# Patient Record
Sex: Female | Born: 1955 | Race: Black or African American | Hispanic: No | Marital: Married | Smoking: Never smoker
Health system: Southern US, Community
[De-identification: ages and names within clinical notes are randomized; demographics above are authoritative.]

## PROBLEM LIST (undated history)

## (undated) DIAGNOSIS — I10 Essential (primary) hypertension: Secondary | ICD-10-CM

## (undated) DIAGNOSIS — M1710 Unilateral primary osteoarthritis, unspecified knee: Secondary | ICD-10-CM

## (undated) DIAGNOSIS — F419 Anxiety disorder, unspecified: Secondary | ICD-10-CM

## (undated) DIAGNOSIS — R51 Headache: Secondary | ICD-10-CM

## (undated) DIAGNOSIS — R519 Headache, unspecified: Secondary | ICD-10-CM

## (undated) HISTORY — DX: Unilateral primary osteoarthritis, unspecified knee: M17.10

## (undated) HISTORY — DX: Anxiety disorder, unspecified: F41.9

## (undated) HISTORY — PX: TUBAL LIGATION: SHX77

---

## 2000-04-04 ENCOUNTER — Emergency Department (HOSPITAL_COMMUNITY): Admission: EM | Admit: 2000-04-04 | Discharge: 2000-04-05 | Payer: Self-pay | Admitting: Emergency Medicine

## 2004-09-01 ENCOUNTER — Encounter: Admission: RE | Admit: 2004-09-01 | Discharge: 2004-09-01 | Payer: Self-pay | Admitting: Family Medicine

## 2006-02-14 ENCOUNTER — Emergency Department (HOSPITAL_COMMUNITY): Admission: EM | Admit: 2006-02-14 | Discharge: 2006-02-14 | Payer: Self-pay | Admitting: Emergency Medicine

## 2008-08-05 ENCOUNTER — Ambulatory Visit: Payer: Self-pay | Admitting: Family Medicine

## 2010-05-10 ENCOUNTER — Ambulatory Visit: Payer: Self-pay | Attending: Occupational Medicine | Admitting: Occupational Therapy

## 2010-05-17 ENCOUNTER — Encounter: Payer: Self-pay | Admitting: Occupational Therapy

## 2010-06-16 ENCOUNTER — Encounter: Payer: Self-pay | Admitting: Occupational Therapy

## 2011-06-27 ENCOUNTER — Emergency Department (INDEPENDENT_AMBULATORY_CARE_PROVIDER_SITE_OTHER)
Admission: EM | Admit: 2011-06-27 | Discharge: 2011-06-27 | Disposition: A | Discharge status: Home / Self Care | Payer: Self-pay | Source: Home / Self Care | Attending: Emergency Medicine | Admitting: Emergency Medicine

## 2011-06-27 ENCOUNTER — Encounter (HOSPITAL_COMMUNITY): Payer: Self-pay

## 2011-06-27 DIAGNOSIS — IMO0002 Reserved for concepts with insufficient information to code with codable children: Secondary | ICD-10-CM

## 2011-06-27 DIAGNOSIS — M1710 Unilateral primary osteoarthritis, unspecified knee: Secondary | ICD-10-CM

## 2011-06-27 DIAGNOSIS — M179 Osteoarthritis of knee, unspecified: Secondary | ICD-10-CM

## 2011-06-27 HISTORY — DX: Essential (primary) hypertension: I10

## 2011-06-27 MED ORDER — MELOXICAM 15 MG PO TABS: 15.0000 mg | ORAL_TABLET | Freq: Every day | ORAL | Status: DC

## 2011-06-27 NOTE — ED Notes (Signed)
C/o lt knee soreness.  States sore when she walks- has been bothered with this intermittently since Feb.  Denies swelling today, but states it swells at times.  Denies injury.

## 2011-06-27 NOTE — Discharge Instructions (Signed)
Take the medication as written. Take 1 gram of tylenol with the motrin up to 4 times a day as needed for pain. This with the meloxicam is an effective combination for pain. Do not exceed 4 grams of tylenol a day from all sources. Return if you get worse, have a  fever >100.4, or for any concerns.   Go to www.goodrx.com to look up your medications. This will give you a list of where you can find your prescriptions at the most affordable prices.

## 2011-06-27 NOTE — ED Provider Notes (Signed)
History     CSN: [id number]  Arrival date & time 06/27/11  1019   First MD Initiated Contact with Patient 06/27/11 1112      Chief Complaint  Patient presents with  . Knee Pain    (Consider location/radiation/quality/duration/timing/severity/associated sxs/prior treatment) HPI Comments:  Patient reports intermittent left knee pain, swelling at the past several months. States her knee tends to swell after running, or or doing a lot of walking. States she uses anti-inflammatories, rest, ice with improvement in swelling. No nausea, vomiting, redness, fevers. No recent or remote history of injury to the knee. No history of gout or diabetes. Patient is here today because she needs a work note that states that she is able to return to work.  ROS as noted in HPI. All other ROS negative.   Patient is a 56 y.o. female presenting with knee pain. The history is provided by the patient.  Knee Pain This is a chronic problem. The current episode started more than 1 week ago. The problem occurs rarely. The problem has been resolved. The symptoms are aggravated by walking. The symptoms are relieved by ice, NSAIDs and rest. Treatments tried: Ice, NSAIDs, rest. The treatment provided moderate relief.    Past Medical History  Diagnosis Date  . Hypertension     Past Surgical History  Procedure Date  . Tubal ligation     History reviewed. No pertinent family history.  History  Substance Use Topics  . Smoking status: Never Smoker   . Smokeless tobacco: Not on file  . Alcohol Use: No    OB History    Grav Para Term Preterm Abortions TAB SAB Ect Mult Living                  Review of Systems  Allergies  Review of patient's allergies indicates no known allergies.  Home Medications   Current Outpatient Rx  Name Route Sig Dispense Refill  . CLONIDINE HCL 0.1 MG PO TABS Oral Take 0.1 mg by mouth at bedtime.    Marland Kitchen LISINOPRIL-HYDROCHLOROTHIAZIDE 20-25 MG PO TABS Oral Take 1 tablet by  mouth daily.    . MELOXICAM 15 MG PO TABS Oral Take 1 tablet (15 mg total) by mouth daily. 14 tablet 0    BP 146/89  Pulse 85  Temp(Src) 98.3 F (36.8 C) (Oral)  Resp 16  SpO2 98%  Physical Exam  Nursing note and vitals reviewed. Constitutional: She is oriented to person, place, and time. She appears well-developed and well-nourished. No distress.  HENT:  Head: Normocephalic and atraumatic.  Eyes: Conjunctivae and EOM are normal.  Neck: Normal range of motion.  Cardiovascular: Normal rate.   Pulmonary/Chest: Effort normal.  Abdominal: She exhibits no distension.  Musculoskeletal: Normal range of motion.       Left knee: She exhibits normal range of motion, no swelling, no effusion, no ecchymosis, no deformity, no erythema, normal alignment, no LCL laxity, normal patellar mobility, normal meniscus and no MCL laxity. no tenderness found.       Mild crepitus on flexion-extension.  Neurological: She is alert and oriented to person, place, and time.  Skin: Skin is warm and dry.  Psychiatric: She has a normal mood and affect. Her behavior is normal. Judgment and thought content normal.    ED Course  Procedures (including critical care time)  Labs Reviewed - No data to display No results found.   1. Osteoarthritis, knee      MDM  Patient's currently without complaints.  Suspect mild arthritis flares causing her swelling. No effusion today. No history of trauma. Deferring imaging. Patient's primary concern today is getting a work note stating that she can return to work. Will sent home with knee sleeve, meloxicam, instructions for rest, ice, elevation when it flares and work note clearing her for return to work.Doreatha Lew follow [facility] if needed.  Luiz Blare, MD 06/27/11 1159

## 2011-09-26 ENCOUNTER — Encounter (HOSPITAL_COMMUNITY): Payer: Self-pay

## 2011-09-26 ENCOUNTER — Emergency Department (HOSPITAL_COMMUNITY): Payer: Self-pay

## 2011-09-26 ENCOUNTER — Observation Stay (HOSPITAL_COMMUNITY)
Admission: EM | Admit: 2011-09-26 | Discharge: 2011-09-27 | Disposition: A | Discharge status: Home / Self Care | Payer: Self-pay | Attending: Emergency Medicine | Admitting: Emergency Medicine

## 2011-09-26 DIAGNOSIS — R079 Chest pain, unspecified: Principal | ICD-10-CM | POA: Insufficient documentation

## 2011-09-26 DIAGNOSIS — R110 Nausea: Secondary | ICD-10-CM | POA: Insufficient documentation

## 2011-09-26 DIAGNOSIS — I10 Essential (primary) hypertension: Secondary | ICD-10-CM | POA: Insufficient documentation

## 2011-09-26 DIAGNOSIS — R61 Generalized hyperhidrosis: Secondary | ICD-10-CM | POA: Insufficient documentation

## 2011-09-26 LAB — CBC
HCT: 36.7 % (ref 36.0–46.0)
Hemoglobin: 12 g/dL (ref 12.0–15.0)
MCH: 31.5 pg (ref 26.0–34.0)
MCHC: 32.7 g/dL (ref 30.0–36.0)
MCV: 96.3 fL (ref 78.0–100.0)
Platelets: 214 10*3/uL (ref 150–400)
RBC: 3.81 MIL/uL — ABNORMAL LOW (ref 3.87–5.11)
RDW: 14.8 % (ref 11.5–15.5)
WBC: 6.7 10*3/uL (ref 4.0–10.5)

## 2011-09-26 LAB — POCT I-STAT, CHEM 8
BUN: 14 mg/dL (ref 6–23)
Calcium, Ion: 1.27 mmol/L — ABNORMAL HIGH (ref 1.12–1.23)
Chloride: 102 mEq/L (ref 96–112)
Creatinine, Ser: 1 mg/dL (ref 0.50–1.10)
Glucose, Bld: 115 mg/dL — ABNORMAL HIGH (ref 70–99)
HCT: 39 % (ref 36.0–46.0)
Hemoglobin: 13.3 g/dL (ref 12.0–15.0)
Potassium: 3.5 mEq/L (ref 3.5–5.1)
Sodium: 140 mEq/L (ref 135–145)
TCO2: 28 mmol/L (ref 0–100)

## 2011-09-26 LAB — POCT I-STAT TROPONIN I
Troponin i, poc: 0 ng/mL (ref 0.00–0.08)
Troponin i, poc: 0 ng/mL (ref 0.00–0.08)
Troponin i, poc: 0 ng/mL (ref 0.00–0.08)

## 2011-09-26 LAB — BASIC METABOLIC PANEL
BUN: 15 mg/dL (ref 6–23)
CO2: 29 mEq/L (ref 19–32)
Calcium: 10 mg/dL (ref 8.4–10.5)
Chloride: 101 mEq/L (ref 96–112)
Creatinine, Ser: 0.8 mg/dL (ref 0.50–1.10)
GFR calc Af Amer: >90 mL/min (ref >90)
GFR calc non Af Amer: 81 mL/min — ABNORMAL LOW (ref >90)
Glucose, Bld: 115 mg/dL — ABNORMAL HIGH (ref 70–99)
Potassium: 3.5 mEq/L (ref 3.5–5.1)
Sodium: 139 mEq/L (ref 135–145)

## 2011-09-26 MED ORDER — SODIUM CHLORIDE 0.9 % IV SOLN: 20.0000 mL | INTRAVENOUS | Status: DC

## 2011-09-26 MED ORDER — MORPHINE SULFATE 4 MG/ML IJ SOLN
4.0000 mg | Freq: Once | INTRAMUSCULAR | Status: AC
  Administered 2011-09-26: 4 mg via INTRAVENOUS
  Filled 2011-09-26: qty 1

## 2011-09-26 MED ORDER — METOPROLOL TARTRATE 25 MG PO TABS
100.0000 mg | ORAL_TABLET | Freq: Once | ORAL | Status: AC
  Administered 2011-09-26: 100 mg via ORAL
  Filled 2011-09-26: qty 4

## 2011-09-26 MED ORDER — ASPIRIN 81 MG PO CHEW
324.0000 mg | CHEWABLE_TABLET | Freq: Once | ORAL | Status: AC
  Administered 2011-09-26: 324 mg via ORAL
  Filled 2011-09-26: qty 4

## 2011-09-26 MED ORDER — NITROGLYCERIN 0.4 MG SL SUBL
0.4000 mg | SUBLINGUAL_TABLET | SUBLINGUAL | Status: AC | PRN
  Administered 2011-09-26 (×3): 0.4 mg via SUBLINGUAL

## 2011-09-26 NOTE — ED Provider Notes (Signed)
The patient has been pain free in CDU. Given metoprolol challenge to insure adequate affect on heart rate with good response. Anticipate CTA in a.m. to r/o CAD.  Rodena Medin, PA-C 09/26/11 1950

## 2011-09-26 NOTE — ED Notes (Signed)
Told patient we needed to weigh when she got up to the bathroom

## 2011-09-26 NOTE — ED Notes (Signed)
Pt report nagging chest pain that began yesterday around 10am. Today worsened while out running errands. Pt reports increased chest heaviness and nausea with radiation to left arm. Currently rating pain 6/10.

## 2011-09-26 NOTE — ED Notes (Signed)
PT GIVEN METOPROLOL TRIAL TO SEE IF SHE CAN LOWER HER HEART RATE FOR CT IN THE MORNING. PT AGREEABLE TO TRIAL OF MEDICATION. SINUS RHYTHM ON MONITOR. MEAL ORDERED.

## 2011-09-26 NOTE — ED Provider Notes (Signed)
History     CSN: [id number]  Arrival date & time 09/26/11  1208   First MD Initiated Contact with Patient 09/26/11 1230      Chief Complaint  Patient presents with  . Chest Pain    (Consider location/radiation/quality/duration/timing/severity/associated sxs/prior treatment) HPI  complains of anterior chest pain radiating to left arm onset yesterday morning discomfort is constant however waxes and wanes. She is unable to tell me whether discomfort is exertional. Admits to mild nausea yesterday and breaking out into cold sweat yesterday. No shortness of breath no other associated symptoms presently discomfort is mild. No treatment prior to coming here. Pain is nonpleuritic feels like a heaviness at present Past Medical History  Diagnosis Date  . Hypertension     Past Surgical History  Procedure Date  . Tubal ligation     History reviewed. No pertinent family history.  History  Substance Use Topics  . Smoking status: Never Smoker   . Smokeless tobacco: Not on file  . Alcohol Use: No    OB History    Grav Para Term Preterm Abortions TAB SAB Ect Mult Living                  Review of Systems  Constitutional: Negative.   HENT: Negative.   Cardiovascular: Positive for chest pain.  Gastrointestinal: Positive for nausea.  Musculoskeletal: Negative.   Skin: Negative.   Neurological: Negative.   Hematological: Negative.   Psychiatric/Behavioral: Negative.     Allergies  Review of patient's allergies indicates no known allergies.  Home Medications   Current Outpatient Rx  Name Route Sig Dispense Refill  . CLONIDINE HCL 0.1 MG PO TABS Oral Take 0.1 mg by mouth at bedtime.    Marland Kitchen LISINOPRIL-HYDROCHLOROTHIAZIDE 20-25 MG PO TABS Oral Take 1 tablet by mouth daily.    . MELOXICAM 15 MG PO TABS Oral Take 1 tablet (15 mg total) by mouth daily. 14 tablet 0    BP 180/85  Pulse 96  Temp 98.4 F (36.9 C) (Oral)  Resp 16  Ht 5\' 3"  (1.6 m)  Wt 155 lb (70.308 kg)  BMI 27.46  kg/m2  SpO2 97%  Physical Exam  Nursing note and vitals reviewed. Constitutional: She appears well-developed and well-nourished.  HENT:  Head: Normocephalic and atraumatic.  Eyes: Conjunctivae are normal. Pupils are equal, round, and reactive to light.  Neck: Neck supple. No tracheal deviation present. No thyromegaly present.  Cardiovascular: Normal rate and regular rhythm.   No murmur heard. Pulmonary/Chest: Effort normal and breath sounds normal.  Abdominal: Soft. Bowel sounds are normal. She exhibits no distension. There is no tenderness.  Musculoskeletal: Normal range of motion. She exhibits no edema and no tenderness.  Neurological: She is alert. Coordination normal.  Skin: Skin is warm and dry. No rash noted.  Psychiatric: She has a normal mood and affect.    ED Course  Procedures (including critical care time)  Date: 09/26/2011  Rate: 95  Rhythm: normal sinus rhythm  QRS Axis: normal  Intervals: normal  ST/T Wave abnormalities: normal  Conduction Disutrbances:none  Narrative Interpretation:   Old EKG Reviewed: unchanged No change from 04/04/2000 as interpreted by me Results for orders placed during the hospital encounter of 09/26/11  CBC      Component Value Range   WBC 6.7  4.0 - 10.5 K/uL   RBC 3.81 (*) 3.87 - 5.11 MIL/uL   Hemoglobin 12.0  12.0 - 15.0 g/dL   HCT 40.9  81.1 - 91.4 %  MCV 96.3  78.0 - 100.0 fL   MCH 31.5  26.0 - 34.0 pg   MCHC 32.7  30.0 - 36.0 g/dL   RDW 11.9  14.7 - 82.9 %   Platelets 214  150 - 400 K/uL  BASIC METABOLIC PANEL      Component Value Range   Sodium 139  135 - 145 mEq/L   Potassium 3.5  3.5 - 5.1 mEq/L   Chloride 101  96 - 112 mEq/L   CO2 29  19 - 32 mEq/L   Glucose, Bld 115 (*) 70 - 99 mg/dL   BUN 15  6 - 23 mg/dL   Creatinine, Ser 5.62  0.50 - 1.10 mg/dL   Calcium 13.0  8.4 - 86.5 mg/dL   GFR calc non Af Amer 81 (*) >90 mL/min   GFR calc Af Amer >90  >90 mL/min  POCT I-STAT TROPONIN I      Component Value Range    Troponin i, poc 0.00  0.00 - 0.08 ng/mL   Comment 3           POCT I-STAT, CHEM 8      Component Value Range   Sodium 140  135 - 145 mEq/L   Potassium 3.5  3.5 - 5.1 mEq/L   Chloride 102  96 - 112 mEq/L   BUN 14  6 - 23 mg/dL   Creatinine, Ser 7.84  0.50 - 1.10 mg/dL   Glucose, Bld 696 (*) 70 - 99 mg/dL   Calcium, Ion 2.95 (*) 1.12 - 1.23 mmol/L   TCO2 28  0 - 100 mmol/L   Hemoglobin 13.3  12.0 - 15.0 g/dL   HCT 28.4  13.2 - 44.0 %   Dg Chest Port 1 View  09/26/2011  *RADIOLOGY REPORT*  Clinical Data: Mid chest pain, nausea, diaphoresis  PORTABLE CHEST - 1 VIEW  Comparison: None.  Findings: Normal cardiac silhouette and mediastinal contours.  The lungs appear mildly hyperinflated.  Punctate granuloma overlies the peripheral aspect of the left mid lung.  No focal airspace opacities.  No definite pleural effusion or pneumothorax.  No acute osseous abnormalities.  IMPRESSION: No definite acute cardiopulmonary disease on this AP portable examination. Further evaluation with a PA and lateral chest radiograph may be obtained as clinically indicated.  Original Report Authenticated By: Waynard Reeds, M.D.    Labs Reviewed  CBC  BASIC METABOLIC PANEL   No results found. 3:10 PM patient asymptomatic after treatment with 3 sublingual nitroglycerin, aspirin, and morphine 4 mg IV Chest xrayreviewed by me No diagnosis found.  MDM  Patient felt to be low risk for acute coronary syndrome Plan CDU chest pain protocol        Doug Sou, MD 09/26/11 [phone]

## 2011-09-26 NOTE — ED Notes (Signed)
Pt c/o intermittent (L) side chest pain starting yesterday, radiating down (L) arm today, associated w/nausea. Pt describes her pain as a sharp nagging pain

## 2011-09-26 NOTE — ED Notes (Signed)
Radiology at bedside for portable xray.

## 2011-09-27 ENCOUNTER — Observation Stay (HOSPITAL_COMMUNITY): Payer: Self-pay

## 2011-09-27 MED ORDER — LORAZEPAM 2 MG/ML IJ SOLN
1.0000 mg | Freq: Once | INTRAMUSCULAR | Status: AC
  Administered 2011-09-27: 1 mg via INTRAVENOUS
  Filled 2011-09-27: qty 1

## 2011-09-27 MED ORDER — METOPROLOL TARTRATE 1 MG/ML IV SOLN
10.0000 mg | Freq: Once | INTRAVENOUS | Status: AC
  Administered 2011-09-27: 10 mg via INTRAVENOUS

## 2011-09-27 MED ORDER — METOPROLOL TARTRATE 1 MG/ML IV SOLN
INTRAVENOUS | Status: AC
  Administered 2011-09-27: 10 mg via INTRAVENOUS
  Filled 2011-09-27: qty 10

## 2011-09-27 MED ORDER — NITROGLYCERIN 0.4 MG SL SUBL
0.4000 mg | SUBLINGUAL_TABLET | Freq: Once | SUBLINGUAL | Status: AC
  Administered 2011-09-27: 0.4 mg via SUBLINGUAL

## 2011-09-27 MED ORDER — IOHEXOL 350 MG/ML SOLN
80.0000 mL | Freq: Once | INTRAVENOUS | Status: AC | PRN
  Administered 2011-09-27: 80 mL via INTRAVENOUS

## 2011-09-27 MED ORDER — METOPROLOL TARTRATE 1 MG/ML IV SOLN
INTRAVENOUS | Status: AC
  Filled 2011-09-27: qty 10

## 2011-09-27 MED ORDER — METOPROLOL TARTRATE 1 MG/ML IV SOLN: 5.0000 mg | Freq: Once | INTRAVENOUS | Status: DC

## 2011-09-27 MED ORDER — NITROGLYCERIN 0.4 MG SL SUBL
SUBLINGUAL_TABLET | SUBLINGUAL | Status: AC
  Administered 2011-09-27: 0.4 mg via SUBLINGUAL
  Filled 2011-09-27: qty 25

## 2011-09-27 MED ORDER — ACETAMINOPHEN 325 MG PO TABS
650.0000 mg | ORAL_TABLET | Freq: Once | ORAL | Status: AC
  Administered 2011-09-27: 650 mg via ORAL
  Filled 2011-09-27: qty 2

## 2011-09-27 NOTE — ED Provider Notes (Signed)
Patient reports feeling well this morning without chest pain. She reports a good night sleep. She has no complaints or other symptoms at this time.   Filed Vitals:   09/27/11 0510  BP: 112/70  Pulse: 52  Temp:   Resp: 20   She is well-developed, well-nourished in no acute distress. Heart has regular rate and rhythm. Chest and lungs are clear to auscultation bilaterally. No pitting edema of lower extremities bilaterally.   Patient will be have CTA this morning. Pending those results, she will be discharged with follow up with PCP as needed.   11:33 AM Patient's CTA shows mild CAD. She can be discharged home with follow up with PCP as needed. No need for further work up at this time. Patient should return to the ED if she experiences worsening or concerning symptoms.   Katie Beck, PA-C 09/27/11 1134

## 2011-09-27 NOTE — ED Provider Notes (Signed)
Medical screening examination/treatment/procedure(s) were conducted as a shared visit with non-physician practitioner(s) and myself.  I personally evaluated the patient during the encounter  Doug Sou, MD 09/27/11 [phone]

## 2011-09-27 NOTE — ED Notes (Signed)
Patient transported to CT 

## 2011-09-27 NOTE — ED Notes (Signed)
Pt states she is nervous. Given ativan iv to help her relax and decrease her heart rated for ct

## 2011-10-02 NOTE — ED Provider Notes (Signed)
Medical screening examination/treatment/procedure(s) were conducted as a shared visit with non-physician practitioner(s) and myself.  I personally evaluated the patient during the encounter  Toy Baker, MD 10/02/11 2344

## 2011-11-30 ENCOUNTER — Encounter: Payer: Self-pay | Admitting: Internal Medicine

## 2011-11-30 DIAGNOSIS — M179 Osteoarthritis of knee, unspecified: Secondary | ICD-10-CM | POA: Insufficient documentation

## 2011-11-30 DIAGNOSIS — M1710 Unilateral primary osteoarthritis, unspecified knee: Secondary | ICD-10-CM

## 2011-11-30 DIAGNOSIS — F419 Anxiety disorder, unspecified: Secondary | ICD-10-CM | POA: Insufficient documentation

## 2011-11-30 DIAGNOSIS — I10 Essential (primary) hypertension: Secondary | ICD-10-CM | POA: Insufficient documentation

## 2011-11-30 DIAGNOSIS — Z0000 Encounter for general adult medical examination without abnormal findings: Secondary | ICD-10-CM | POA: Insufficient documentation

## 2011-11-30 HISTORY — DX: Osteoarthritis of knee, unspecified: M17.9

## 2011-11-30 HISTORY — DX: Anxiety disorder, unspecified: F41.9

## 2011-11-30 HISTORY — DX: Unilateral primary osteoarthritis, unspecified knee: M17.10

## 2011-12-10 ENCOUNTER — Ambulatory Visit: Payer: Self-pay | Admitting: Internal Medicine

## 2011-12-10 DIAGNOSIS — Z0289 Encounter for other administrative examinations: Secondary | ICD-10-CM

## 2012-01-16 ENCOUNTER — Ambulatory Visit (INDEPENDENT_AMBULATORY_CARE_PROVIDER_SITE_OTHER): Payer: Self-pay | Admitting: Family Medicine

## 2012-01-16 VITALS — BP 132/78 | HR 61 | Temp 98.2°F | Ht 62.5 in | Wt 154.0 lb

## 2012-01-16 DIAGNOSIS — I10 Essential (primary) hypertension: Secondary | ICD-10-CM

## 2012-01-16 DIAGNOSIS — R2240 Localized swelling, mass and lump, unspecified lower limb: Secondary | ICD-10-CM

## 2012-01-16 DIAGNOSIS — R229 Localized swelling, mass and lump, unspecified: Secondary | ICD-10-CM

## 2012-01-16 MED ORDER — BISOPROLOL FUMARATE 5 MG PO TABS: 5.0000 mg | ORAL_TABLET | Freq: Every day | ORAL | Status: DC

## 2012-01-16 NOTE — Patient Instructions (Addendum)
Thank you for coming in today, it was nice to meet you Your blood pressure looks great today. Continue your current medications other than the bisoprolol-hctz, start bisoprolol only.  Follow up with me once you have the assistance card.

## 2012-01-20 DIAGNOSIS — R2240 Localized swelling, mass and lump, unspecified lower limb: Secondary | ICD-10-CM | POA: Insufficient documentation

## 2012-01-20 NOTE — Assessment & Plan Note (Signed)
Small raised nodular area, very tender.  Likely to be varicosity but could also be something such as glomus tumor given amount of pain associated with it.

## 2012-01-20 NOTE — Assessment & Plan Note (Signed)
Blood pressure well controlled on current medications however she is on two medications that contain HCTZ.  Will discontinue bisoprolol-hctz and change to bisoprolol only.   COntinue lisinopril-hctz and clonidine.  Warned of not discontinuing clonidine abruptly.  She will follow up once she has assistance card and we will check lab work at that time.

## 2012-01-20 NOTE — Progress Notes (Signed)
  Subjective:    Patient ID: Katie Humphrey, female    DOB: 12/11/1956, 56 y.o.   MRN: [id number]  HPI Patient is a new patient here to establish care and discuss  1. HTN:  Has never had PCP, has been receiving medications from a local urgent care.  She is currently using bisoprolol-hctz, lisinopril-hctz, and clonidine.  Her BP has been well controlled on this regiment and she tolerates the medication well.  She does have occasional sharp chest pain but nothing currently.  She denies shortness of breath, headache, palpitations, vision changes.  2. Bump on leg:  Bump on lower r leg.  Has been present for a "couple of years".  Area is quite painful to touch.  She denies any calf swelling or tenderness.  Review of Systems Per HPI    Objective:   Physical Exam  Constitutional: She appears well-nourished. No distress.  Neck: Neck supple.  Cardiovascular: Normal rate, regular rhythm and normal heart sounds.   Pulmonary/Chest: Effort normal and breath sounds normal. No respiratory distress.  Musculoskeletal: She exhibits no edema.       She has a small raised blue-purple colored area on lateral right lower leg.  Very tender to touch. No calf swelling.   Neurological: She is alert.          Assessment & Plan:

## 2012-12-05 ENCOUNTER — Emergency Department (HOSPITAL_COMMUNITY)
Admission: EM | Admit: 2012-12-05 | Discharge: 2012-12-05 | Disposition: A | Discharge status: Home / Self Care | Payer: BC Managed Care – PPO | Source: Home / Self Care | Attending: Family Medicine | Admitting: Family Medicine

## 2012-12-05 ENCOUNTER — Encounter (HOSPITAL_COMMUNITY): Payer: Self-pay | Admitting: Emergency Medicine

## 2012-12-05 DIAGNOSIS — I10 Essential (primary) hypertension: Secondary | ICD-10-CM

## 2012-12-05 LAB — POCT I-STAT, CHEM 8
BUN: 10 mg/dL (ref 6–23)
Calcium, Ion: 1.22 mmol/L (ref 1.12–1.23)
Chloride: 102 mEq/L (ref 96–112)
Creatinine, Ser: 1 mg/dL (ref 0.50–1.10)
Glucose, Bld: 108 mg/dL — ABNORMAL HIGH (ref 70–99)
HCT: 45 % (ref 36.0–46.0)
Hemoglobin: 15.3 g/dL — ABNORMAL HIGH (ref 12.0–15.0)
Potassium: 4.4 mEq/L (ref 3.5–5.1)
Sodium: 140 mEq/L (ref 135–145)
TCO2: 30 mmol/L (ref 0–100)

## 2012-12-05 MED ORDER — CLONIDINE HCL 0.1 MG PO TABS: 0.1000 mg | ORAL_TABLET | Freq: Every day | ORAL | Status: DC

## 2012-12-05 MED ORDER — BISOPROLOL-HYDROCHLOROTHIAZIDE 5-6.25 MG PO TABS: 1.0000 | ORAL_TABLET | Freq: Every day | ORAL | Status: DC

## 2012-12-05 NOTE — ED Provider Notes (Signed)
CSN: [id number]     Arrival date & time 12/05/12  1016 History   None    Chief Complaint  Patient presents with  . Medication Refill    out of BP med x 1 wk.    (Consider location/radiation/quality/duration/timing/severity/associated sxs/prior Treatment) HPI Comments: 57 year old female presents requesting refill of her antihypertensive medications. She ran out one week ago. She's been having a slight headache since that, but this is common with her blood pressure being uncontrolled. She denies nausea, chest pain, lightheadedness, blurry vision.   Past Medical History  Diagnosis Date  . Hypertension   . OA (osteoarthritis) of knee 11/30/2011  . Anxiety 11/30/2011   Past Surgical History  Procedure Laterality Date  . Tubal ligation     History reviewed. No pertinent family history. History  Substance Use Topics  . Smoking status: Never Smoker   . Smokeless tobacco: Never Used  . Alcohol Use: No   OB History   Grav Para Term Preterm Abortions TAB SAB Ect Mult Living                 Review of Systems  Constitutional: Negative for fever and chills.  Eyes: Negative for visual disturbance.  Respiratory: Negative for cough and shortness of breath.   Cardiovascular: Negative for chest pain, palpitations and leg swelling.  Gastrointestinal: Negative for nausea, vomiting and abdominal pain.  Endocrine: Negative for polydipsia and polyuria.  Genitourinary: Negative for dysuria, urgency and frequency.  Musculoskeletal: Negative for arthralgias and myalgias.  Skin: Negative for rash.  Neurological: Positive for headaches. Negative for dizziness, weakness and light-headedness.    Allergies  Review of patient's allergies indicates no known allergies.  Home Medications   Current Outpatient Rx  Name  Route  Sig  Dispense  Refill  . bisoprolol (ZEBETA) 5 MG tablet   Oral   Take 1 tablet (5 mg total) by mouth daily.   30 tablet   6   . bisoprolol-hydrochlorothiazide (ZIAC)  5-6.25 MG per tablet   Oral   Take 1 tablet by mouth daily.   30 tablet   0   . cloNIDine (CATAPRES) 0.1 MG tablet   Oral   Take 1 tablet (0.1 mg total) by mouth at bedtime.   30 tablet   0   . lisinopril-hydrochlorothiazide (PRINZIDE,ZESTORETIC) 20-25 MG per tablet   Oral   Take 1 tablet by mouth daily.          BP 200/120  Pulse 63  Temp(Src) 98.1 F (36.7 C) (Oral)  Resp 12  SpO2 100% Physical Exam  Nursing note and vitals reviewed. Constitutional: She is oriented to person, place, and time. Vital signs are normal. She appears well-developed and well-nourished. No distress.  HENT:  Head: Normocephalic and atraumatic.  Cardiovascular: Normal rate, regular rhythm and normal heart sounds.   Pulmonary/Chest: Effort normal and breath sounds normal. No respiratory distress.  Neurological: She is alert and oriented to person, place, and time. She has normal strength. Coordination normal.  Skin: Skin is warm and dry. No rash noted. She is not diaphoretic.  Psychiatric: She has a normal mood and affect. Judgment normal.    ED Course  Procedures (including critical care time) Labs Review Labs Reviewed  POCT I-STAT, CHEM 8 - Abnormal; Notable for the following:    Glucose, Bld 108 (*)    Hemoglobin 15.3 (*)    All other components within normal limits   Imaging Review No results found.    MDM   1.  Hypertension    Exam is normal. I-STAT is normal. Refilling medications. Followup with primary care.   Meds ordered this encounter  Medications  . cloNIDine (CATAPRES) 0.1 MG tablet    Sig: Take 1 tablet (0.1 mg total) by mouth at bedtime.    Dispense:  30 tablet    Refill:  0    Order Specific Question:  Supervising Provider    Answer:  Clementeen Graham, S [id number]  . bisoprolol-hydrochlorothiazide (ZIAC) 5-6.25 MG per tablet    Sig: Take 1 tablet by mouth daily.    Dispense:  30 tablet    Refill:  0    Order Specific Question:  Supervising Provider    Answer:   Clementeen Graham, Kathie Rhodes [[id number]]       Graylon Good, PA-C 12/05/12 [phone]

## 2012-12-05 NOTE — ED Notes (Signed)
Reports out of BP med Clonidine 1 mg x 1 wk. Having headaches.  Pt does not have primary doctor but does have health insurance.

## 2012-12-06 NOTE — ED Provider Notes (Signed)
Medical screening examination/treatment/procedure(s) were performed by a resident physician or non-physician practitioner and as the supervising physician I was immediately available for consultation/collaboration.  Clementeen Graham, MD    Rodolph Bong, MD 12/06/12 [phone]

## 2012-12-22 ENCOUNTER — Encounter: Payer: Self-pay | Admitting: Family Medicine

## 2012-12-22 ENCOUNTER — Ambulatory Visit (INDEPENDENT_AMBULATORY_CARE_PROVIDER_SITE_OTHER): Payer: BC Managed Care – PPO | Admitting: Family Medicine

## 2012-12-22 VITALS — BP 129/81 | HR 63 | Ht 62.5 in | Wt 160.6 lb

## 2012-12-22 DIAGNOSIS — M1710 Unilateral primary osteoarthritis, unspecified knee: Secondary | ICD-10-CM

## 2012-12-22 DIAGNOSIS — IMO0002 Reserved for concepts with insufficient information to code with codable children: Secondary | ICD-10-CM

## 2012-12-22 DIAGNOSIS — F411 Generalized anxiety disorder: Secondary | ICD-10-CM

## 2012-12-22 DIAGNOSIS — Z0000 Encounter for general adult medical examination without abnormal findings: Secondary | ICD-10-CM

## 2012-12-22 DIAGNOSIS — F419 Anxiety disorder, unspecified: Secondary | ICD-10-CM

## 2012-12-22 DIAGNOSIS — M179 Osteoarthritis of knee, unspecified: Secondary | ICD-10-CM

## 2012-12-22 DIAGNOSIS — I10 Essential (primary) hypertension: Secondary | ICD-10-CM

## 2012-12-22 MED ORDER — BISOPROLOL-HYDROCHLOROTHIAZIDE 5-6.25 MG PO TABS: 1.0000 | ORAL_TABLET | Freq: Every day | ORAL | Status: DC

## 2012-12-22 MED ORDER — CLONIDINE HCL 0.1 MG PO TABS: 0.1000 mg | ORAL_TABLET | Freq: Every day | ORAL | Status: DC

## 2012-12-22 NOTE — Assessment & Plan Note (Signed)
Information given for scheduling outpt mammogram. Colonoscopy normal per pt report within the last 10 years. Pt to f/u for appointment specifically for Pap. Declines Tdap today. 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

## 2012-12-22 NOTE — Assessment & Plan Note (Signed)
Well-controlled on bisoprolol-HCTZ and clonidine; recently ran out for 1 week and had UC visit with pressure 200/100. Refilled both today. Need to consider checking basic labs at f/u.

## 2012-12-22 NOTE — Patient Instructions (Signed)
Thank you for coming in, today!  I refilled your blood pressure medicine, today. Make sure you get a mammogram. We do recommend that you get a tetanus booster and a pap smear You won't be due for a colonoscopy for another 3 or 4 years.  Make an appointment to come back to have a pap smear. Other than that, you can come back in about 6 months, or sooner if you need. Please feel free to call with any questions or concerns at any time, at [phone]. --Dr. Casper Harrison

## 2012-12-22 NOTE — Progress Notes (Signed)
  Subjective:    Patient ID: Katie Humphrey, female    DOB: 12/11/1956, 57 y.o.   MRN: [id number]  HPI: Pt presents to clinic for general follow-up. Specifically requests refill on BP medications.  BP - has been on clonidine and bisoprolol-HCTZ ; recently ran out for about 1 week, went to UC with BP 200/100 -had BP meds refilled, has been better (no headaches, chest pain, LE edema) -currently has no symptoms  Chronic arthritis of knees - baseline, symptoms worse with changes in weather, cold weather - left typically worse than right, no current complaint - does not take any medications currently  Hx of anxiety - in remote past, pt has been prescribed ?Prozac but did not take for more than a few doses (caused hallucinations) - denies current active symptoms; reports being "nervous" at times, but states "it passes" and "I feel pretty good" - denies SI/HI, now or in the past  Health maintenance - uncertain of last pap ("several years"), few years since last mammogram, uncertain last tetanus booster - states she had a colonoscopy in ?2007 (?around age 41), which was normal  Pt is a never smoker. Pt is sexually active with one female partner. Pt has not had periods in >15 years. She is s/p tubal ligation. In addition to the above documentation, pt's PMH, surgical history, FH, and SH all reviewed and updated where appropriate in the EMR. I have also reviewed and updated the pt's allergies and current medications as appropriate.  Review of Systems: Otherwise, full 12-system ROS was reviewed and all negative.     Objective:   Physical Exam BP 129/81  Pulse 63  Ht 5' 2.5" (1.588 m)  Wt 160 lb 9.6 oz (72.848 kg)  BMI 28.89 kg/m2 Gen: well-appearing adult female in NAD HEENT: [place]/AT, sclerae/conjunctivae clear, no lid lag, EOMI, PERRLA   MMM, posterior oropharynx clear, no cervical lymphadenopathy  neck supple with full ROM, no masses appreciated; thyroid not enlarged  Cardio: RRR, no  murmur appreciated; distal pulses intact/symmetric Pulm: CTAB, no wheezes, normal WOB  Abd: soft, nondistended, BS+, no HSM Ext: warm/well-perfused, no cyanosis/clubbing/edema MSK: strength 5/5 in all four extremities, no frank joint deformity/effusion  normal ROM to all four extremities with no point muscle/bony tenderness in spine Neuro/Psych: alert/oriented, sensation grossly intact; normal gait/balance  mood euthymic with congruent affect     Assessment & Plan:

## 2012-12-22 NOTE — Assessment & Plan Note (Signed)
No current complaints or specific medications. Consider plain xrays in the future if re-develops or acutely worsens; suspect degenerative disease. F/u as needed.

## 2012-12-22 NOTE — Assessment & Plan Note (Signed)
No specific current symptoms and pt reports intolerance to ?Prozac in the past. Denies SI/HI. Plan to f/u as needed.

## 2013-01-06 ENCOUNTER — Telehealth: Payer: Self-pay | Admitting: Family Medicine

## 2013-01-06 NOTE — Telephone Encounter (Signed)
Spoke with patient and she knows that she uses clonidine 0.1 mg for HTN, but that also, in the past, it has helped her sleep.  This last prescriptionvct she got was from  a different tablet.  Pt states she is willing to give it a few more times  Katie Humphrey, Katie Humphrey, CMA

## 2013-01-06 NOTE — Telephone Encounter (Signed)
Patient's Rx for clonidine 0.1 mg qHS is for blood pressure, not specifically for sleep. If she is having trouble sleeping, she can try OTC meds such as Benadryl or Unisom. If she is having more issues than that, or if she tries those and they don't help, or if she wants, she can schedule an appointment to be seen. I don't think I have any clinic openings for a couple of weeks, though. Thanks. --CMS

## 2013-01-06 NOTE — Telephone Encounter (Signed)
Pt says she is not sleeping with clonedine .01.  Her prescription shows clonadone HCL. Please advise

## 2013-01-06 NOTE — Telephone Encounter (Signed)
Will fwd. To Dr.Street for review. Thanks. Lorenda Hatchet, Renato Battles

## 2013-02-02 ENCOUNTER — Encounter: Payer: Self-pay | Admitting: Family Medicine

## 2013-02-18 ENCOUNTER — Encounter: Payer: Self-pay | Admitting: Family Medicine

## 2013-02-18 ENCOUNTER — Ambulatory Visit (INDEPENDENT_AMBULATORY_CARE_PROVIDER_SITE_OTHER): Payer: BC Managed Care – PPO | Admitting: Family Medicine

## 2013-02-18 ENCOUNTER — Telehealth: Payer: Self-pay | Admitting: *Deleted

## 2013-02-18 VITALS — Ht 62.5 in | Wt 160.0 lb

## 2013-02-18 DIAGNOSIS — B372 Candidiasis of skin and nail: Secondary | ICD-10-CM | POA: Insufficient documentation

## 2013-02-18 MED ORDER — CLOTRIMAZOLE 1 % EX CREA: 1.0000 "application " | TOPICAL_CREAM | Freq: Two times a day (BID) | CUTANEOUS | Status: DC

## 2013-02-18 NOTE — Telephone Encounter (Signed)
Patient completed PCP change request form on 12/24/12--wants to change to female PCP.  Changed PCP to Dr. Richarda Blade.  Scheduled appt for 03/20/13 at 4:00 pm to meet new MD/CPE.  Gaylene Brooks, RN

## 2013-02-18 NOTE — Progress Notes (Signed)
   Subjective:    Patient ID: Katie Humphrey, female    DOB: 12/11/1956, 56 y.o.   MRN: [id number]  HPI: Pt presents to clinic with complaint of "yeast infection-like rash" for about 3 weeks. The skin in and around her superior gluteal cleft has been red and very irritated. She has been using Gold Bond powder without any real relief. The area is very painful and dries / cracks but has had no bleeding or drainage. She has no vaginal / vulvar involvement and denies vaginal discharge, itching, or burning. She has no systemic symptoms. She is not diabetic and has not had any recent antibiotic use.  Review of Systems: As above. Denies fever / chills, N/V, change in stool habits.     Objective:   Physical Exam Ht 5' 2.5" (1.588 m)  Wt 160 lb (72.576 kg)  BMI 28.78 kg/m2 Gen: well-appearing adult female in no acute distress Skin: superior portion of gluteal cleft red, shiny, with small circular areas of redness / maceration  Overall appearance consistent with candidal-type skin rash and satellite lesions GU: no external vaginal abnormalities, lesions, or rash HEENT: [place]/AT, sclerae clear Abd: soft, nontender, nondistended Ext: warm, well-perfused, no LE edema     Assessment & Plan:

## 2013-02-18 NOTE — Assessment & Plan Note (Signed)
A: Rash present to superior portion of gluteal cleft, consistent with candidal-type rash +/- component of intertrigo. No systemic symptoms and no vaginal / vulvar complaints.  P: Rx for clotrimazole cream. Discussed appropriate skin care (normal hygiene, careful skin drying, use of medications). Also advised she may continue to use Gold Bond or similar powder between applications of antifungal cream. F/u as needed or in 1 week if worsening / spreading.

## 2013-02-18 NOTE — Patient Instructions (Signed)
Thank you for coming in, today!  You have a yeast infection between your buttocks. You can use a anti-yeast cream that I will prescribe. You can use medicated powder between applications of the cream if you want. You can shower like normal. When you dry off, make sure the skin that has the rash is dried very carefully and try not to leave moisture in folds of skin. You should use the cream twice a day until the rash improves. After that, use it for 1 more week once a day, then you can use it, as needed. If you have worse rash, itching / burning, or if the area starts to be much redder, gets swollen, or starts to drain yellowish or clear fluid, come back to clinic right away (this could be a worse infection). Also make sure to let us know if you have systemic symptoms like fever, chills, nausea, vomiting, belly pain or diarrhea.  You can come back to follow-up as needed. You can talk to our clinic nurse about changing doctors. Please feel free to call with any questions or concerns at any time, at [phone]. --Dr. Casper Harrison

## 2013-03-17 ENCOUNTER — Ambulatory Visit (INDEPENDENT_AMBULATORY_CARE_PROVIDER_SITE_OTHER): Payer: BC Managed Care – PPO | Admitting: Family Medicine

## 2013-03-17 ENCOUNTER — Encounter: Payer: Self-pay | Admitting: Family Medicine

## 2013-03-17 VITALS — BP 150/99 | HR 66 | Temp 98.5°F | Ht 62.5 in | Wt 160.0 lb

## 2013-03-17 DIAGNOSIS — G4700 Insomnia, unspecified: Secondary | ICD-10-CM | POA: Insufficient documentation

## 2013-03-17 DIAGNOSIS — I10 Essential (primary) hypertension: Secondary | ICD-10-CM

## 2013-03-17 MED ORDER — BISOPROLOL-HYDROCHLOROTHIAZIDE 5-6.25 MG PO TABS: 1.0000 | ORAL_TABLET | Freq: Every day | ORAL | Status: DC

## 2013-03-17 MED ORDER — CLONIDINE HCL 0.1 MG PO TABS: 0.1000 mg | ORAL_TABLET | Freq: Every day | ORAL | Status: DC

## 2013-03-17 NOTE — Assessment & Plan Note (Signed)
Elevated BP today, previously well controlled. Suspect elevated BP today related to stress from work, possible white coat HTN, with more normal BPs reported from home. Meds - Bisoprolol-HCTZ 5-6.25mg  daily, Clonidine 0.1mg  bedtime (for insomnia) No complications   Plan:  1. Continue current BP meds - Refilled both Bisoprolol-HCTZ and Clonidine 2. No indication for labs today. Last Cr checked 11/2012, normal. 3. Advised to keep detailed BP journal (3x week, home and drug store) bring in to review in 1 month. 4. RTC 1 month to re-check BP.  Future Considerations: - BP Med changes. If needed, would split combo pill into Bisoprolol 5mg  daily and inc HCTZ to 12.5mg  daily - fasting lipid panel for more accurate calculation for 10 yr ACSVD risk, given cardiac risk factors ( age >43, AA race, HTN)

## 2013-03-17 NOTE — Assessment & Plan Note (Signed)
Chronic h/o insomnia, noted remote h/o treatment for mood disorder? With prozac, but did not tolerate. Suspect insomnia likely secondary Currently on Clonidine 0.1mg  nightly (started in 2009). Note tried OTC Melatonin without success  Plan: 1. Refilled Clonidine 2. Discussed sleep hygiene, overall no major problems identified  Future: - Consider trial of low dose Amitriptyline 10mg  nightly for sleep instead of Clonidine

## 2013-03-17 NOTE — Patient Instructions (Signed)
Dear Katie Humphrey, Thank you for coming in to clinic today. It was good to meet you!  Today we discussed your Blood Pressure. 1. Overall, it looks like your blood pressure is fairly well controlled on your current medicines (Bisoprolol-HCTZ), as the Clonidine is probably not affecting your blood pressure at all, and is only helping you sleep. 2. We will not make any changes to your meds today, I have sent refills for both meds to your pharmacy. 3. We recommend that you continue to check your blood pressure at home and keep a detailed journal of your readings (time of day, activities etc), check your pressure at least 3 x a week for 1 month, and bring that journal in to our office for review. Also, I would recommend checking your pressure at a store like [facility] to get a different reading from a different machine. 4. If your pressures remain controlled < 140 / 90 consistently at home, then you may not need any further changes to your medicines. 5. For your sleep, we will continue the Clonidine at this time, however this is something that can be discussed further with your regular doctor. The most beneficial treatments for sleep / insomnia are usually behavioral changes ("Sleep Hygiene"), otherwise if those changes are unsuccessful, we can consider a different medication in the future.  Some important numbers from today's visit: BP - 150/99, re-checked at 140/98  Please schedule a follow-up appointment with Dr. Sherril Cong (PCP) in 1 month for BP re-check.  If you have any other questions or concerns, please feel free to call the clinic to contact me. You may also schedule an earlier appointment if necessary.  However, if your symptoms get significantly worse, please go to the Emergency Department to seek immediate medical attention.  Nobie Putnam, DO [facility]   Insomnia Insomnia is frequent trouble falling and/or staying asleep. Insomnia can be a long term  problem or a short term problem. Both are common. Insomnia can be a short term problem when the wakefulness is related to a certain stress or worry. Long term insomnia is often related to ongoing stress during waking hours and/or poor sleeping habits. Overtime, sleep deprivation itself can make the problem worse. Every little thing feels more severe because you are overtired and your ability to cope is decreased. CAUSES   Stress, anxiety, and depression.  Poor sleeping habits.  Distractions such as TV in the bedroom.  Naps close to bedtime.  Engaging in emotionally charged conversations before bed.  Technical reading before sleep.  Alcohol and other sedatives. They may make the problem worse. They can hurt normal sleep patterns and normal dream activity.  Stimulants such as caffeine for several hours prior to bedtime.  Pain syndromes and shortness of breath can cause insomnia.  Exercise late at night.  Changing time zones may cause sleeping problems (jet lag). It is sometimes helpful to have someone observe your sleeping patterns. They should look for periods of not breathing during the night (sleep apnea). They should also look to see how long those periods last. If you live alone or observers are uncertain, you can also be observed at a sleep clinic where your sleep patterns will be professionally monitored. Sleep apnea requires a checkup and treatment. Give your caregivers your medical history. Give your caregivers observations your family has made about your sleep.  SYMPTOMS   Not feeling rested in the morning.  Anxiety and restlessness at bedtime.  Difficulty falling and staying  asleep. TREATMENT   Your caregiver may prescribe treatment for an underlying medical disorders. Your caregiver can give advice or help if you are using alcohol or other drugs for self-medication. Treatment of underlying problems will usually eliminate insomnia problems.  Medications can be prescribed  for short time use. They are generally not recommended for lengthy use.  Over-the-counter sleep medicines are not recommended for lengthy use. They can be habit forming.  You can promote easier sleeping by making lifestyle changes such as:  Using relaxation techniques that help with breathing and reduce muscle tension.  Exercising earlier in the day.  Changing your diet and the time of your last meal. No night time snacks.  Establish a regular time to go to bed.  Counseling can help with stressful problems and worry.  Soothing music and white noise may be helpful if there are background noises you cannot remove.  Stop tedious detailed work at least one hour before bedtime. HOME CARE INSTRUCTIONS   Keep a diary. Inform your caregiver about your progress. This includes any medication side effects. See your caregiver regularly. Take note of:  Times when you are asleep.  Times when you are awake during the night.  The quality of your sleep.  How you feel the next day. This information will help your caregiver care for you.  Get out of bed if you are still awake after 15 minutes. Read or do some quiet activity. Keep the lights down. Wait until you feel sleepy and go back to bed.  Keep regular sleeping and waking hours. Avoid naps.  Exercise regularly.  Avoid distractions at bedtime. Distractions include watching television or engaging in any intense or detailed activity like attempting to balance the household checkbook.  Develop a bedtime ritual. Keep a familiar routine of bathing, brushing your teeth, climbing into bed at the same time each night, listening to soothing music. Routines increase the success of falling to sleep faster.  Use relaxation techniques. This can be using breathing and muscle tension release routines. It can also include visualizing peaceful scenes. You can also help control troubling or intruding thoughts by keeping your mind occupied with boring or  repetitive thoughts like the old concept of counting sheep. You can make it more creative like imagining planting one beautiful flower after another in your backyard garden.  During your day, work to eliminate stress. When this is not possible use some of the previous suggestions to help reduce the anxiety that accompanies stressful situations. MAKE SURE YOU:   Understand these instructions.  Will watch your condition.  Will get help right away if you are not doing well or get worse. Document Released: 02/03/2000 Document Revised: 04/30/2011 Document Reviewed: 03/05/2007 [facility] Patient Information 2014 [place].

## 2013-03-17 NOTE — Progress Notes (Signed)
Subjective:     Patient ID: Katie Humphrey, female   DOB: 12/11/1956, 58 y.o.   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 Reports no complaints. Has 1 refill remaining on BP meds, concerned that last time she ran out her BP was significantly elevated. Current Meds - Bisoprolol-HCTZ 5-6.25mg  daily in AM, Clonidine 0.1mg  bedtime (for sleep)   Reports good compliance, took meds today. Tolerating well, w/o complaints. Home Monitoring - Checks BP at home, recalls pressure being about 122/80 last checked. Lifestyle - Walks for exercise Denies CP, dyspnea, claudication, HA, edema, dizziness / lightheadedness  INSOMNIA: Reports chronic h/o insomnia Currently taking Clonidine 0.1mg  once at bedtime for sleep, stated that this was originally prescribed back in 2009 in the ED for BP, however it helps her sleep. Not interested in switching this medicine today, but agreeable to potential alternative in the future. - Admits to good sleep hygiene. No TV in bedroom, does not spend time in bedroom during day, walks for exercise, limited caffeine (1 cup coffee in morning daily).  I have reviewed and updated the following as appropriate: allergies and current medications  Social Hx: Never smoker. Occupation - [employer] bus driver.   Review of Systems  See above HPI, additionally: Admits chronic left knee pain.     Objective:   Physical Exam  BP 150/99  Pulse 66  Temp(Src) 98.5 F (36.9 C) (Oral)  Ht 5' 2.5" (1.588 m)  Wt 160 lb (72.576 kg)  BMI 28.78 kg/m2  Re-checked BP (manual) at 140/98  Gen - pleasant, well-appearing, NAD HEENT - PERRL, EOMI, MMM Neck - supple, non-tender, no carotid bruits Heart - RRR, no murmurs heard Lungs - CTAB, no wheezing, crackles, or rhonchi. Normal work of breathing. Skin - warm, dry, no rashes Neuro - awake, alert,  oriented, grossly non-focal, intact muscle strength 5/5 b/l, intact distal sensation to light touch     Assessment:     See specific A&P problem list for details.      Plan:     See specific A&P problem list for details.

## 2013-03-20 ENCOUNTER — Encounter: Payer: Self-pay | Admitting: Family Medicine

## 2013-05-01 ENCOUNTER — Encounter: Payer: Self-pay | Admitting: Emergency Medicine

## 2013-05-01 ENCOUNTER — Ambulatory Visit (HOSPITAL_COMMUNITY)
Admission: RE | Admit: 2013-05-01 | Discharge: 2013-05-01 | Disposition: A | Discharge status: Home / Self Care | Payer: No Typology Code available for payment source | Source: Ambulatory Visit | Attending: Family Medicine | Admitting: Family Medicine

## 2013-05-01 ENCOUNTER — Ambulatory Visit (INDEPENDENT_AMBULATORY_CARE_PROVIDER_SITE_OTHER): Payer: No Typology Code available for payment source | Admitting: Emergency Medicine

## 2013-05-01 VITALS — BP 126/91 | HR 65 | Temp 98.4°F | Wt 159.0 lb

## 2013-05-01 DIAGNOSIS — R079 Chest pain, unspecified: Secondary | ICD-10-CM | POA: Insufficient documentation

## 2013-05-01 LAB — POCT GLYCOSYLATED HEMOGLOBIN (HGB A1C): Hemoglobin A1C: 5.6

## 2013-05-01 MED ORDER — NITROGLYCERIN 0.4 MG SL SUBL: 0.4000 mg | SUBLINGUAL_TABLET | SUBLINGUAL | Status: DC | PRN

## 2013-05-01 NOTE — Progress Notes (Signed)
   Subjective:    Patient ID: Katie Humphrey, female    DOB: 12/11/1956, 58 y.o.   MRN: [id number]  HPI Katie Humphrey is here for chest pains.  She reports intermittent chest pains for the last 2 weeks.  Occuring 0-2 times per day.  Located centrally, described as a "nagging ache," no radiation.  Associated with a funny feeling in her left arm and feeling warm.  Will sometimes get some mild shortness of breath.  No associated dizziness.  They last for 5-10 minutes and resolve spontaneously.  Mostly seem to occur at rest, no identified triggers.  No active chest pain.  She has hypertension.  No diabetes and is a non smoker.  Family history is positive for heart disease in her mother, sisters and brother.  She does take a baby aspirin a day.  Current Outpatient Prescriptions on File Prior to Visit  Medication Sig Dispense Refill  . bisoprolol-hydrochlorothiazide (ZIAC) 5-6.25 MG per tablet Take 1 tablet by mouth daily.  30 tablet  3  . cloNIDine (CATAPRES) 0.1 MG tablet Take 1 tablet (0.1 mg total) by mouth at bedtime.  30 tablet  3  . clotrimazole (LOTRIMIN) 1 % cream Apply 1 application topically 2 (two) times daily.  30 g  1   No current facility-administered medications on file prior to visit.    I have reviewed and updated the following as appropriate: allergies, current medications, past family history, past medical history and past social history SHx: never smoker   Review of Systems See HPI    Objective:   Physical Exam BP 126/91  Pulse 65  Temp(Src) 98.4 F (36.9 C) (Oral)  Wt 159 lb (72.122 kg) Gen: alert, cooperative, NAD HEENT: AT/, sclera white, MMM Neck: supple, no bruits CV: RRR, no murmurs Pulm: CTAB, no wheezes or rales Ext: no edema, 2+ radial pulses      Assessment & Plan:

## 2013-05-01 NOTE — Assessment & Plan Note (Signed)
Both typical and atypical components. With her strong family history, will work up for angina. Referral to cardiology placed for stress test. Lipids, A1c and TSH today. Continue daily aspirin. Prescription for nitroglycerin sent. Reasons to go the ER reviewed as in AVS. F/u in 1 month.

## 2013-05-01 NOTE — Patient Instructions (Signed)
It was nice to meet you!  I am a little worried that the chest pains you are having are coming from your heart. - keep taking the daily aspirin - I sent in nitroglycerin.  Let 1 pill dissolve under your tongue the next time you have chest pain to see if it helps.  It might give you a headache or make you dizzy. - You should be getting a call in the next week to set up an appointment with a cardiologist.  GO TO THE EMERGENCY ROOM IF... - the chest pain is severe - the pain lasts more than 15 minutes - the pain causes you to feel really short of breath or dizzy  Follow up with your regular doctor in about 1 month.   Angina Pectoris Angina pectoris is extreme discomfort in your chest, neck, or arm. Your doctor may call it just angina. It is caused by a lack of oxygen to your heart wall. It may feel like tightness or heavy pressure. It may feel like a crushing or squeezing pain. Some people say it feels like gas. It may go down your shoulders, back, and arms. Some people have symptoms other than pain. These include:  Tiredness.  Shortness of breath.  Cold sweats.  Feeling sick to your stomach (nausea). There are four types of angina:  Stable angina often lasts the same amount of time each time it happens. Activity, stress, or excitement can bring it on. It often gets better after taking a special medicine called nitroglycerin. This goes under your tongue.  Unstable angina can happen when you are not active or even during sleep. It can suddenly get worse or happen more often. It may not get better after taking the special medicine. It can last up to 30 minutes.  Microvascular angina is more common in women. It may be more severe or last longer than other types.  Prinzmetal angina often happens when you are not active or in the early morning hours. HOME CARE   Only take medicines as told by your doctor.  Stay active or exercise more as told by your doctor.  Limit very hard activity  as told by your doctor.  Limit heavy lifting as told by your doctor.  Keep a healthy weight.  Learn about and eat foods that are healthy for your heart.  Do not smoke. GET HELP RIGHT AWAY IF:   You have chest, neck, deep shoulder, or arm pain or discomfort that lasts more than a few minutes.  You have chest, neck, deep shoulder, or arm pain or discomfort that goes away and comes back over and over again.  You have heavy sweating that seems to happen for no reason.  You have shortness of breath or trouble breathing.  Your angina does not get better after a few minutes of rest.  Your angina does not get better after you take nitroglycerin medicine. These can all be symptoms of a heart attack. Get help right away. Call your local emergency service (911 in [place] Do not  drive yourself to the hospital. Do not  wait to for your symptoms to go away. MAKE SURE YOU:   Understand these instructions.  Will watch your condition.  Will get help right away if you are not doing well or get worse. Document Released: 07/25/2007 Document Revised: 01/23/2012 Document Reviewed: 11/15/2011 [facility] Patient Information 2014 [place], [place].

## 2013-05-02 LAB — LIPID PANEL
Cholesterol: 193 mg/dL (ref 0–200)
HDL: 91 mg/dL (ref >39)
LDL Cholesterol: 84 mg/dL (ref 0–99)
Total CHOL/HDL Ratio: 2.1 Ratio
Triglycerides: 88 mg/dL (ref <150)
VLDL: 18 mg/dL (ref 0–40)

## 2013-05-02 LAB — TSH: TSH: 0.467 u[IU]/mL (ref 0.350–4.500)

## 2013-05-13 ENCOUNTER — Institutional Professional Consult (permissible substitution): Payer: No Typology Code available for payment source | Admitting: Cardiology

## 2013-06-14 ENCOUNTER — Encounter: Payer: Self-pay | Admitting: Cardiology

## 2013-06-15 ENCOUNTER — Ambulatory Visit (INDEPENDENT_AMBULATORY_CARE_PROVIDER_SITE_OTHER): Payer: No Typology Code available for payment source | Admitting: Cardiology

## 2013-06-15 ENCOUNTER — Encounter: Payer: Self-pay | Admitting: Cardiology

## 2013-06-15 VITALS — BP 142/90 | HR 68 | Ht 61.0 in | Wt 156.8 lb

## 2013-06-15 DIAGNOSIS — R079 Chest pain, unspecified: Secondary | ICD-10-CM

## 2013-06-15 DIAGNOSIS — I10 Essential (primary) hypertension: Secondary | ICD-10-CM

## 2013-06-15 NOTE — Assessment & Plan Note (Signed)
The patient does have risk factors for coronary disease. Her current chest pain is not exertional. EKG does not show any marked abnormalities. I have decided to proceed with a standard GXT tomorrow. I will do this with her and then we will decide if any further workup is needed.

## 2013-06-15 NOTE — Progress Notes (Signed)
Patient ID: Katie Humphrey, female   DOB: 12/11/1956, 58 y.o.   MRN: [id number]    HPI  Patient is seen today as a new patient evaluation for chest discomfort. There is no prior documented coronary disease. There is a history of treated hypertension. She does not have diabetes. Her LDL was 84. There is a family history of coronary disease. Her mother had did have an MI at a relatively early age. She has had intermittent slight chest discomfort since early March, 2015. The discomfort comes on in a random fashion. It is not related to exertion. It resolved spontaneously. Sometimes she has tingling in her fingers with it. There is no nausea vomiting or diaphoresis. Her EKG when checked in March showed no significant abnormality.  As part of today's evaluation I have carefully reviewed the information in the electronic medical record. I have reviewed her primary care office note.  No Known Allergies  Current Outpatient Prescriptions  Medication Sig Dispense Refill  . bisoprolol-hydrochlorothiazide (ZIAC) 5-6.25 MG per tablet Take 1 tablet by mouth daily.  30 tablet  3  . cloNIDine (CATAPRES) 0.1 MG tablet Take 1 tablet (0.1 mg total) by mouth at bedtime.  30 tablet  3  . clotrimazole (LOTRIMIN) 1 % cream Apply 1 application topically 2 (two) times daily.  30 g  1  . nitroGLYCERIN (NITROSTAT) 0.4 MG SL tablet Place 1 tablet (0.4 mg total) under the tongue every 5 (five) minutes as needed for chest pain.  50 tablet  3   No current facility-administered medications for this visit.    History   Social History  . Marital Status: Single    Spouse Name: N/A    Number of Children: N/A  . Years of Education: N/A   Occupational History  . Not on file.   Social History Main Topics  . Smoking status: Never Smoker   . Smokeless tobacco: Never Used  . Alcohol Use: No  . Drug Use: No  . Sexual Activity: Not Currently   Other Topics Concern  . Not on file   Social History Narrative  . No  narrative on file    No family history on file.  Past Medical History  Diagnosis Date  . Hypertension   . OA (osteoarthritis) of knee 11/30/2011  . Anxiety 11/30/2011    Past Surgical History  Procedure Laterality Date  . Tubal ligation      Patient Active Problem List   Diagnosis Date Noted  . Chest pain 05/01/2013  . Insomnia 03/17/2013  . Skin yeast infection 02/18/2013  . Anxiety 11/30/2011  . OA (osteoarthritis) of knee 11/30/2011  . Preventative health care 11/30/2011  . Hypertension     ROS   Patient denies fever, chills, headache, sweats, rash, change in vision, change in hearing,  cough, nausea vomiting, urinary symptoms.  PHYSICAL EXAM   Patient is oriented to person time and place. Affect is normal. In general she looks quite stable. Head is atraumatic. Conjunctiva are normal. There is no jugulovenous distention. Lungs are clear. Respiratory effort is nonlabored. Cardiac exam reveals S1 and S2. There are no significant murmurs. The abdomen is soft. There is no peripheral edema. There are no musculoskeletal deformities. There are no skin rashes.  Filed Vitals:   06/15/13 1107  BP: 142/90  Pulse: 68  Height: 5\' 1"  (1.549 m)  Weight: 156 lb 12.8 oz (71.124 kg)   I reviewed her EKG from May 01, 2013. It was normal. I have not repeated  an EKG today as we will obtain 1 with her GXT tomorrow.  ASSESSMENT & PLAN

## 2013-06-15 NOTE — Assessment & Plan Note (Signed)
Her blood pressure is treated. No change in therapy.

## 2013-06-15 NOTE — Patient Instructions (Addendum)
**Note De-Identified Heman Que Obfuscation** Your physician has requested that you have an exercise tolerance test. For further information please visit [email or web address]. Please also follow instruction sheet, as given. Please arrive in the office at 10:45 on 06/16/13  Your physician recommends that you continue on your current medications as directed. Please refer to the Current Medication list given to you today.

## 2013-06-16 ENCOUNTER — Encounter: Payer: Self-pay | Admitting: Cardiology

## 2013-06-16 ENCOUNTER — Ambulatory Visit (INDEPENDENT_AMBULATORY_CARE_PROVIDER_SITE_OTHER): Payer: No Typology Code available for payment source | Admitting: Cardiology

## 2013-06-16 DIAGNOSIS — R079 Chest pain, unspecified: Secondary | ICD-10-CM

## 2013-06-16 NOTE — Progress Notes (Signed)
Exercise Treadmill Test  Pre-Exercise Testing Evaluation Rhythm: normal sinus  Rate: 60   PR:  .18 QRS:  .06  QT:  .42             Test  Exercise Tolerance Test Ordering MD: Dola Argyle, MD  Interpreting MD: Dola Argyle, MD  Unique Test No: 1 Treadmill:  1  Indication for ETT: chest pain - rule out ischemia  Contraindication to ETT: No   Stress Modality: exercise - treadmill  Cardiac Imaging Performed: non   Protocol: standard Bruce - maximal  Max BP:  177/111  Max MPHR (bpm):  163 85% MPR (bpm):  139  MPHR obtained (bpm):  142 % MPHR obtained:  87  Reached 85% MPHR (min:sec):  4:20 Total Exercise Time (min-sec):  6:00  Workload in METS:  7.0 Borg Scale: 15  Reason ETT Terminated:  Total HR reached    ST Segment Analysis At Rest: normal ST segments - no evidence of significant ST depression With Exercise: no evidence of significant ST depression  Other Information Arrhythmia:  No Angina during ETT:  absent (0) Quality of ETT:  diagnostic  ETT Interpretation:  normal - no evidence of ischemia by ST analysis  Comments: The patient exercised well. The patient did not have the chest discomfort that she has intermittently at other times. There was no evidence of ischemia. There was no stress-induced arrhythmia. This is a negative (normal) GXT.  Recommendations:  Based on this study no further cardiac workup will be done at this time. I will see the patient back for followup to reassess her symptoms in the future.  Daryel November, MD

## 2013-06-16 NOTE — Progress Notes (Signed)
Patient ID: ZYAIRE DUMAS, female   DOB: 12/11/1956, 58 y.o.   MRN: [id number]  The patient was seen today for a treadmill. I will try to get the official report in. I saw her yesterday in the office and we decided to bring her back for treadmill.  Patient walked 6 minutes on the Bruce protocol. She exercised well. She was fatigued. She reached greater than 85% predicted maximum heart rate. There was no chest pain. The study was limited by fatigue and because we had adequate heart rate. There was no significant EKG change. There was one PVC noted in recovery. This was a negative exercise tolerance test. It is normal.  There is no evidence of ischemia.  Based on the results of the treadmill, I feel that the patient's recent chest discomfort is most likely not cardiac in origin. I decided not to proceed with any other tests. I have made arrangements for her to be seen I me in followup in approximately 3 months. At that time I will reassess her symptoms. In the meantime she will go about full activity. If she has any significant worsening symptoms. She will return for early followup.  JeffKatz, MD

## 2013-06-19 ENCOUNTER — Ambulatory Visit (INDEPENDENT_AMBULATORY_CARE_PROVIDER_SITE_OTHER): Payer: No Typology Code available for payment source | Admitting: Family Medicine

## 2013-06-19 ENCOUNTER — Encounter: Payer: Self-pay | Admitting: Family Medicine

## 2013-06-19 VITALS — BP 149/101 | HR 81 | Temp 98.3°F | Ht 61.0 in | Wt 155.8 lb

## 2013-06-19 DIAGNOSIS — R0982 Postnasal drip: Secondary | ICD-10-CM | POA: Insufficient documentation

## 2013-06-19 MED ORDER — GUAIFENESIN-CODEINE 100-10 MG/5ML PO SYRP: 5.0000 mL | ORAL_SOLUTION | Freq: Every evening | ORAL | Status: DC | PRN

## 2013-06-19 MED ORDER — CETIRIZINE HCL 10 MG PO TABS
10.0000 mg | ORAL_TABLET | Freq: Every day | ORAL | Status: DC
Start: 2013-06-19 — End: 2014-04-30

## 2013-06-19 NOTE — Assessment & Plan Note (Signed)
A: Cough likely due to PND due to allergies vs. URI. Lungs clear  P: - Con't zyrtec - Declined Flonase - OTC plain Robitussin, avoid decongestants with HTN - Robitussin AC qhs prn for sleep - F/u if fails to improve

## 2013-06-19 NOTE — Patient Instructions (Signed)
I am sorry you are having a hard time.  You can try plain Robitussin (generic is fine) without decongestant during the day.  Take the prescription cough medicine at nighttime. Take one Zyrtec daily.  Follow up if you have a high fever, pain or if you are not getting better.  Amber M. Hairford, M.D.

## 2013-06-19 NOTE — Progress Notes (Signed)
Patient ID: SAVANHA ISLAND, female   DOB: 12/11/1956, 58 y.o.   MRN: [id number]    Subjective: HPI: Patient is a 58 y.o. female presenting to clinic today for allergies and cough.  Cough Patient complains of eye irritation, nasal congestion, nonproductive cough, sneezing and post-nasal drip. Symptoms began 5 days ago. Symptoms have been gradually worsening since that time.The cough is dry and is aggravated by nothing. Associated symptoms include: See above.. Patient does not have new pets. Patient does not have a history of asthma. Patient does have a history of environmental allergens. Patient has not traveled recently. Patient does not have a history of smoking. Patient states cough is most bothersome. She has tried Coricidin and throat drops.   History Reviewed: Non smoker.  ROS: Please see HPI above.  Objective: Office vital signs reviewed. BP 149/101  Pulse 81  Temp(Src) 98.3 F (36.8 C) (Oral)  Ht 5\' 1"  (1.549 m)  Wt 155 lb 12.8 oz (70.67 kg)  BMI 29.45 kg/m2  Physical Examination:  General: Awake, alert. NAD HEENT: Atraumatic, normocephalic. MMM. Posterior pharynx erythematous without exudate. TM wnl.  Neck: No masses palpated. No LAD Pulm: CTAB, no wheezes. Mild barky cough Cardio: RRR, no murmurs appreciated Abdomen:+BS, soft, nontender, nondistended Extremities: No edema Neuro: Grossly intact  Assessment: 58 y.o. female with post-nasal drip  Plan: See Problem List and After Visit Summary

## 2013-07-10 ENCOUNTER — Encounter: Payer: Self-pay | Admitting: Family Medicine

## 2013-07-10 ENCOUNTER — Ambulatory Visit (INDEPENDENT_AMBULATORY_CARE_PROVIDER_SITE_OTHER): Payer: No Typology Code available for payment source | Admitting: Family Medicine

## 2013-07-10 VITALS — BP 125/78 | HR 58 | Temp 98.1°F | Ht 61.0 in | Wt 155.0 lb

## 2013-07-10 DIAGNOSIS — J309 Allergic rhinitis, unspecified: Secondary | ICD-10-CM | POA: Insufficient documentation

## 2013-07-10 DIAGNOSIS — B372 Candidiasis of skin and nail: Secondary | ICD-10-CM

## 2013-07-10 DIAGNOSIS — T7840XA Allergy, unspecified, initial encounter: Secondary | ICD-10-CM

## 2013-07-10 MED ORDER — OLOPATADINE HCL 0.1 % OP SOLN: 1.0000 [drp] | Freq: Two times a day (BID) | OPHTHALMIC | Status: DC

## 2013-07-10 MED ORDER — KETOCONAZOLE 2 % EX CREA: 1.0000 "application " | TOPICAL_CREAM | Freq: Every day | CUTANEOUS | Status: DC

## 2013-07-10 NOTE — Assessment & Plan Note (Signed)
Intertrigo.  Will change lotrimin for ketoconazole and instructed pt to keep area dry. F/u as needed.

## 2013-07-10 NOTE — Progress Notes (Signed)
Family Medicine Office Visit Note   Subjective:   Patient ID: Katie Humphrey, female  DOB: 12/11/1956, 58 y.o.. MRN: [id number]   Pt that comes today for same day appointment complaining of eye itching and redness as well as a lesion on her buttock that's does not appear to heal. #1. Eye redness: noticed for about a week, accompanied of itchiness. No drainage, no involvement of eyelids. Worse when she is outdoors. She has history of allergies and is taking Cetirizine but not helping much with her localized symptoms. Denies fever, chills, nausea vomiting or other systemic symptoms. No hx of sick contact or travel.   #2. Skin lesion on her buttock: reports present for along time. Has used Lotrimin, A&D ointment and talcum powder in different occasions and they have not helped completely. Lesion is itchy. No other similar lesions are reported.   Review of Systems:  Pt denies SOB, chest pain, palpitations, headaches, dizziness, numbness or weakness.   Objective:   Physical Exam: Gen:  NAD HEENT: Moist mucous membranes. Eyes: EOMI, PERRL, mild injected conjunctiva bilaterally w/o involvement of surrounded tissue. Limited fundoscopy appears normal.  Nose: no rhinorrhea, normal turbinates. CV: Regular rate and rhythm, no murmurs rubs or gallops PULM: Clear to auscultation bilaterally. No wheezes/rales/rhonchi EXT: No edema Skin: moist macerated skin rash occupying most of the intergluteal area. No edema or erythema. Lesion does not extend to genitalia.   Assessment & Plan:

## 2013-07-10 NOTE — Assessment & Plan Note (Signed)
Taking Zyrtec. Continue Start Patanol  F/u as needed

## 2013-07-10 NOTE — Patient Instructions (Addendum)
It has been a pleasure to see you today. It seems that the reason of your eye symptoms are allergic in nature. Please take the medications as prescribed. If you develop further redness, drainage or other symptoms please come for re-evaluation.   For you lesion on your back stop using lotrimin and start Ketoconazole twice a day for 4 weeks or until resolution of lesion.

## 2013-09-01 ENCOUNTER — Telehealth: Payer: Self-pay | Admitting: Family Medicine

## 2013-09-01 ENCOUNTER — Other Ambulatory Visit: Payer: Self-pay | Admitting: Family Medicine

## 2013-09-01 NOTE — Telephone Encounter (Signed)
Pt called and needs refills on her BP medications jw

## 2013-09-16 ENCOUNTER — Encounter: Payer: Self-pay | Admitting: Cardiology

## 2013-09-17 ENCOUNTER — Ambulatory Visit (INDEPENDENT_AMBULATORY_CARE_PROVIDER_SITE_OTHER): Payer: No Typology Code available for payment source | Admitting: Cardiology

## 2013-09-17 ENCOUNTER — Encounter: Payer: Self-pay | Admitting: Cardiology

## 2013-09-17 VITALS — BP 110/64 | HR 67 | Ht 61.0 in | Wt 157.8 lb

## 2013-09-17 DIAGNOSIS — I10 Essential (primary) hypertension: Secondary | ICD-10-CM

## 2013-09-17 DIAGNOSIS — R072 Precordial pain: Secondary | ICD-10-CM

## 2013-09-17 NOTE — Assessment & Plan Note (Signed)
Her treadmill was normal. She's not having any significant chest pain. No further workup.

## 2013-09-17 NOTE — Progress Notes (Signed)
Patient ID: Katie Humphrey, female   DOB: 12/11/1956, 58 y.o.   MRN: [id number]    HPI  Patient is seen today to followup prior chest discomfort. I saw her April 27,2015 with chest pain. We decided to proceed with a standard treadmill the following day. This was done. The treadmill was normal. I told her to go by full activities. The plan was to see her back today to see how she was doing overall. She's been very active. She's actually even playing some basketball in her driveway with her son.  No Known Allergies  Current Outpatient Prescriptions  Medication Sig Dispense Refill  . bisoprolol-hydrochlorothiazide (ZIAC) 5-6.25 MG per tablet Take 1 tablet by mouth daily.  30 tablet  3  . cetirizine (ZYRTEC) 10 MG tablet Take 1 tablet (10 mg total) by mouth daily.  30 tablet  11  . cloNIDine (CATAPRES) 0.1 MG tablet TAKE ONE TABLET BY MOUTH AT BEDTIME  30 tablet  0  . clotrimazole (LOTRIMIN) 1 % cream Apply 1 application topically 2 (two) times daily.  30 g  1  . guaiFENesin-codeine (ROBITUSSIN AC) 100-10 MG/5ML syrup Take 5 mLs by mouth at bedtime as needed for cough.  120 mL  0  . ketoconazole (NIZORAL) 2 % cream Apply 1 application topically daily.  15 g  0  . nitroGLYCERIN (NITROSTAT) 0.4 MG SL tablet Place 1 tablet (0.4 mg total) under the tongue every 5 (five) minutes as needed for chest pain.  50 tablet  3  . olopatadine (PATANOL) 0.1 % ophthalmic solution Place 1 drop into both eyes 2 (two) times daily.  5 mL  12   No current facility-administered medications for this visit.    History   Social History  . Marital Status: Single    Spouse Name: N/A    Number of Children: N/A  . Years of Education: N/A   Occupational History  . Not on file.   Social History Main Topics  . Smoking status: Never Smoker   . Smokeless tobacco: Never Used  . Alcohol Use: No  . Drug Use: No  . Sexual Activity: Not Currently   Other Topics Concern  . Not on file   Social History Narrative  .  No narrative on file    No family history on file.  Past Medical History  Diagnosis Date  . Hypertension   . OA (osteoarthritis) of knee 11/30/2011  . Anxiety 11/30/2011    Past Surgical History  Procedure Laterality Date  . Tubal ligation      Patient Active Problem List   Diagnosis Date Noted  . Allergy 07/10/2013  . Post-nasal drip 06/19/2013  . Chest pain 05/01/2013  . Insomnia 03/17/2013  . Skin yeast infection 02/18/2013  . Anxiety 11/30/2011  . OA (osteoarthritis) of knee 11/30/2011  . Preventative health care 11/30/2011  . Hypertension     ROS   Patient denies fever, chills, headache, sweats, rash, change in vision, change in hearing, chest pain, cough, nausea or vomiting, urinary symptoms. All other systems are reviewed and are negative.  PHYSICAL EXAM  Patient is oriented to person time and place. Affect is normal. There is no jugulovenous distention. Lungs are clear. Respiratory effort is nonlabored. Cardiac exam reveals S1 and S2. The abdomen is soft. There is no peripheral edema.  Filed Vitals:   09/17/13 1339  BP: 110/64  Pulse: 67  Height: 5\' 1"  (1.549 m)  Weight: 157 lb 12.8 oz (71.578 kg)  ASSESSMENT & PLAN

## 2013-09-17 NOTE — Assessment & Plan Note (Signed)
Blood pressure stable. No change in therapy. The patient will require no further cardiac evaluation.

## 2013-09-17 NOTE — Patient Instructions (Signed)
**Note De-identified Katie Humphrey Obfuscation** Your physician recommends that you continue on your current medications as directed. Please refer to the Current Medication list given to you today.  Your physician recommends that you schedule a follow-up appointment in: as needed  

## 2013-09-28 ENCOUNTER — Other Ambulatory Visit: Payer: Self-pay | Admitting: Family Medicine

## 2013-10-02 ENCOUNTER — Ambulatory Visit (INDEPENDENT_AMBULATORY_CARE_PROVIDER_SITE_OTHER): Payer: No Typology Code available for payment source | Admitting: Family Medicine

## 2013-10-02 ENCOUNTER — Encounter: Payer: Self-pay | Admitting: Family Medicine

## 2013-10-02 ENCOUNTER — Other Ambulatory Visit: Payer: Self-pay | Admitting: Family Medicine

## 2013-10-02 ENCOUNTER — Ambulatory Visit
Admission: RE | Admit: 2013-10-02 | Discharge: 2013-10-02 | Disposition: A | Discharge status: Home / Self Care | Payer: No Typology Code available for payment source | Source: Ambulatory Visit | Attending: Family Medicine | Admitting: Family Medicine

## 2013-10-02 VITALS — BP 132/73 | HR 76 | Temp 98.0°F | Wt 157.0 lb

## 2013-10-02 DIAGNOSIS — M1712 Unilateral primary osteoarthritis, left knee: Secondary | ICD-10-CM

## 2013-10-02 DIAGNOSIS — I10 Essential (primary) hypertension: Secondary | ICD-10-CM

## 2013-10-02 DIAGNOSIS — M1710 Unilateral primary osteoarthritis, unspecified knee: Secondary | ICD-10-CM

## 2013-10-02 DIAGNOSIS — IMO0002 Reserved for concepts with insufficient information to code with codable children: Secondary | ICD-10-CM

## 2013-10-02 MED ORDER — CLONIDINE HCL 0.1 MG PO TABS: ORAL_TABLET | ORAL | Status: DC

## 2013-10-02 MED ORDER — BISOPROLOL-HYDROCHLOROTHIAZIDE 5-6.25 MG PO TABS: ORAL_TABLET | ORAL | Status: DC

## 2013-10-02 NOTE — Patient Instructions (Signed)
It was nice to meet you today!  For your knee: -take tylenol and ice the knee -we are checking xrays -follow up with Dr. Sherril Cong in a few weeks if it is not better  I sent in 2 months of your BP medicines. See Dr. Sherril Cong in order to get more refills.  Be well, Dr. Ardelia Mems

## 2013-10-02 NOTE — Progress Notes (Signed)
Patient ID: Katie Humphrey, female   DOB: 12/11/1956, 58 y.o.   MRN: [id number]  HPI:  Pt presents for a same day appointment to discuss knee pain.  Has had throbbing pain in her L knee x 1 month. Has tried asparcreme, which helps a little. Has not taken any medicines by mouth for this. No injury to the knee. Has had swelling. Pain is worse with standing. Has had pain like this before but the last flare was about a year ago. No recent xrays. No prior injections to her knees.  Also needs refills on BP meds (bisoprolol HCTZ and clonidine). Couldn't get appt with PCP until one month from now.   ROS: See HPI  Busby: hx OA L knee, HTN, anxiety  PHYSICAL EXAM: BP 132/73  Pulse 76  Temp(Src) 98 F (36.7 C) (Oral)  Wt 157 lb (71.215 kg) Gen: NAD HEENT: NCAT Heart: RRR Lungs: CTAB Neuro: grossly nonfocal, speech normal Ext: L knee with mild crepitus on flexion/extension. Very mild effusion present. MCL, LCL intact bilaterally. Lachmans negative bilaterally. Mild TTP over L lateral joint line and anteriorly. Full strength with bilat knee flexion & extension.  ASSESSMENT/PLAN:  OA (osteoarthritis) of knee Flare of chronic issue. No recent imaging. Pt hesitant to have joint injection done. Plan: -for pain relief will start OTC tylenol prn, also ice -check L knee xrays today since no imaging in a while -f/u with PCP in a few weeks if not improved, may benefit from joint injection  Hypertension Normotensive today. Refilled BP meds for one month with one refill, advised pt will need to see PCP for future refills.    FOLLOW UP: F/u in a few weeks with PCP for knee pain if not improved.  Wilder. Ardelia Mems, [place]

## 2013-10-05 NOTE — Assessment & Plan Note (Addendum)
Flare of chronic issue. No recent imaging. Pt hesitant to have joint injection done. Plan: -for pain relief will start OTC tylenol prn, also ice -check L knee xrays today since no imaging in a while -f/u with PCP in a few weeks if not improved, may benefit from joint injection

## 2013-10-05 NOTE — Assessment & Plan Note (Signed)
Normotensive today. Refilled BP meds for one month with one refill, advised pt will need to see PCP for future refills.

## 2013-10-07 ENCOUNTER — Telehealth: Payer: Self-pay | Admitting: Family Medicine

## 2013-10-07 NOTE — Telephone Encounter (Signed)
Xrays showed that pt has mild degenerative (age-related) changes. She should continue the tylenol and ice as we discussed on Friday, and follow up with Dr. Sherril Cong in a few weeks.  Red team, please inform patient.  Leeanne Rio, MD

## 2013-10-07 NOTE — Telephone Encounter (Signed)
Pt requesting results from xrays 10/02/13.

## 2013-10-08 NOTE — Telephone Encounter (Signed)
Patient informed of message from MD, expressed understanding. 

## 2013-10-30 ENCOUNTER — Ambulatory Visit: Payer: No Typology Code available for payment source | Admitting: Family Medicine

## 2013-11-05 ENCOUNTER — Ambulatory Visit (INDEPENDENT_AMBULATORY_CARE_PROVIDER_SITE_OTHER): Payer: No Typology Code available for payment source | Admitting: Family Medicine

## 2013-11-05 ENCOUNTER — Encounter: Payer: Self-pay | Admitting: Family Medicine

## 2013-11-05 VITALS — BP 163/102 | HR 62 | Temp 98.4°F | Wt 158.0 lb

## 2013-11-05 DIAGNOSIS — M79642 Pain in left hand: Secondary | ICD-10-CM | POA: Insufficient documentation

## 2013-11-05 DIAGNOSIS — M79609 Pain in unspecified limb: Secondary | ICD-10-CM

## 2013-11-05 MED ORDER — MELOXICAM 15 MG PO TABS: 15.0000 mg | ORAL_TABLET | Freq: Every day | ORAL | Status: DC

## 2013-11-05 NOTE — Assessment & Plan Note (Addendum)
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 Mobic 15mg  daily x 1 month 2. Relative rest, wrist vs thumb spica splint for activity / sleep, elevation, ice PRN, ROM exercises 3. Out of work x 1 weekend 4. Consider X-rays if persistent or worsening pain 5. RTC 1 month re-evaluation

## 2013-11-05 NOTE — Patient Instructions (Signed)
Dear Katie Humphrey, Thank you for coming in to clinic today. It was good to see you!  Today we discussed your Left Hand pain. 1. It seems like you may have some tendinitis from overuse in your Left wrist. Also, there may be a component of Carpal Tunnel Syndrome. Arthritis and overuse are likely causing your painful flares. 2. Start Mobic 15mg  - take one tablet daily with food. Do not take any Ibuprofen or Aleve with this medicine. 3. Start regular Tylenol (extra strength 500mg ) 1 to 2 tablets up to 3 times a day or every 6 hours (No more than 4000mg  in 24 hours) 4. Continue Icy Hot and Alcohol Rubs as needed 5. Recommend Wrist Splint for support - wear while active / work / and at night while asleep 6. Avoid overuse, heavy lifting for 2 weeks.  If symptoms continue to get worse, or you develop significant numbness, weakness, swelling in your Left hand, please call clinic and return for evaluation. You may need X-rays in future.  Please schedule a follow-up appointment with Dr. Sherril Cong in 1 month to follow-up Hand Pain.  If you have any other questions or concerns, please feel free to call the clinic to contact me. You may also schedule an earlier appointment if necessary.  However, if your symptoms get significantly worse, please go to the Emergency Department to seek immediate medical attention.  Nobie Putnam, [place]

## 2013-11-05 NOTE — Progress Notes (Signed)
   Subjective:    Patient ID: Katie Humphrey, female    DOB: 12/11/1956, 58 y.o.   MRN: [id number]  Patient presents for a same day appointment.  HPI  Left Hand Pain: - Reports left hand pain for past 3 days, described as constant throbbing 5/10. Admits to chronic intermittent history of Left hand pain over past 3 years, with 1-2 painful episodes lasting few days to 1 week, then resolves. Had been evaluated by Orthopedics few years ago, was told it was arthritis, can't recall if had X-rays. Admits to prior history of Left Knee arthritis, recently diagnosed. 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 Occasionally recently as been trying to avoid using it. She is Right handed. Denies any significant injury, fall. - Tried Icy-hot and alcohol rub / soaking with temporary significant relief, Ibuprofen once or twice - Denies fevers/chills, redness, swelling, no other muscle or joint pain  I have reviewed and updated the following as appropriate: allergies and current medications  Social Hx: - Never smoker  Review of Systems  See above HPI    Objective:   Physical Exam  BP 163/102  Pulse 62  Temp(Src) 98.4 F (36.9 C) (Oral)  Wt 158 lb (71.668 kg)  Gen - well-appearing, pleasant, NAD MSK - Left Hand: mild tenderness to palpation over palmar wrist and dorsal hand, mild tenderness over EPB and APL tendons, +Finklestein's test, negative Tinel's and Phalen's. Right Hand - non-tender to palpation. Both wrists normal range of motion, Left inc pain with wrist extension Ext - no edema or erythema, peripheral radial pulses intact +2 b/l Skin - warm, dry, no rashes Neuro - awake, alert, intact muscle strength 5/5 b/l wrists and grip, intact distal sensation to light touch     Assessment & Plan:   See specific A&P problem list for details.

## 2013-11-17 ENCOUNTER — Encounter: Payer: Self-pay | Admitting: Family Medicine

## 2013-11-17 ENCOUNTER — Ambulatory Visit (INDEPENDENT_AMBULATORY_CARE_PROVIDER_SITE_OTHER): Payer: No Typology Code available for payment source | Admitting: Family Medicine

## 2013-11-17 VITALS — BP 124/67 | HR 64 | Temp 98.5°F | Resp 16 | Wt 157.0 lb

## 2013-11-17 DIAGNOSIS — M1710 Unilateral primary osteoarthritis, unspecified knee: Secondary | ICD-10-CM

## 2013-11-17 DIAGNOSIS — M170 Bilateral primary osteoarthritis of knee: Secondary | ICD-10-CM

## 2013-11-17 DIAGNOSIS — I10 Essential (primary) hypertension: Secondary | ICD-10-CM

## 2013-11-17 MED ORDER — CLONIDINE HCL 0.1 MG PO TABS: ORAL_TABLET | ORAL | Status: DC

## 2013-11-17 MED ORDER — BISOPROLOL-HYDROCHLOROTHIAZIDE 5-6.25 MG PO TABS: ORAL_TABLET | ORAL | Status: DC

## 2013-11-17 NOTE — Patient Instructions (Signed)
I have refilled your blood pressure medicines.   For your knee pain, try doing the exercises I have given you for the next few weeks. They should strengthen the muscles around the joint and take some pressure off.   For both the knee and the wrist pain, continue to use ice, rest, elevation and compression. Specifically a brace to give them some support and ice at the end of a long day.  If your abdominal pain gets worse or you notice any blood in your stool, please come back and see me right away.

## 2013-11-17 NOTE — Assessment & Plan Note (Addendum)
Knee and wrist worst now - continue RICE - motrin prn - pt declined injection today

## 2013-11-19 NOTE — Progress Notes (Signed)
   Subjective:    Patient ID: Katie Humphrey, female    DOB: 12/11/1956, 58 y.o.   MRN: [id number]  Knee Pain   Wrist Pain    Pt presents for eval of wrist and knee pain. 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 Has been happening more over the past month. Motrin, ice and rest help. Dull ache      Review of Systems  Musculoskeletal: Positive for arthralgias.  All other systems reviewed and are negative.      Objective:   Physical Exam  Nursing note and vitals reviewed. Constitutional: She is oriented to person, place, and time. She appears well-developed and well-nourished. No distress.  HENT:  Head: Normocephalic and atraumatic.  Eyes: Conjunctivae are normal. Right eye exhibits no discharge. Left eye exhibits no discharge.  Cardiovascular: Normal rate.   Pulmonary/Chest: Effort normal.  Abdominal: She exhibits no distension.  Musculoskeletal:       Left wrist: She exhibits crepitus. She exhibits normal range of motion, no tenderness, no bony tenderness, no swelling, no effusion, no deformity and no laceration.       Left knee: She exhibits normal range of motion, no swelling, no effusion, no ecchymosis, no deformity, no laceration, no erythema, normal alignment, no LCL laxity, normal patellar mobility, no bony tenderness, normal meniscus and no MCL laxity. Tenderness found. Lateral joint line tenderness noted.  Neurological: She is alert and oriented to person, place, and time.  Skin: Skin is warm and dry. She is not diaphoretic.  Psychiatric: She has a normal mood and affect. Her behavior is normal.          Assessment & Plan:

## 2013-11-19 NOTE — Assessment & Plan Note (Signed)
Refilled clonidine and bisoprolol-hctz

## 2013-12-28 ENCOUNTER — Ambulatory Visit: Payer: Self-pay | Admitting: Family Medicine

## 2013-12-31 ENCOUNTER — Ambulatory Visit: Payer: Self-pay | Admitting: Family Medicine

## 2014-04-10 ENCOUNTER — Other Ambulatory Visit: Payer: Self-pay | Admitting: Family Medicine

## 2014-04-10 DIAGNOSIS — I10 Essential (primary) hypertension: Secondary | ICD-10-CM

## 2014-04-12 ENCOUNTER — Other Ambulatory Visit: Payer: Self-pay | Admitting: Family Medicine

## 2014-04-12 NOTE — Telephone Encounter (Signed)
Pt called and needs refills on both of her BP medications. Bisoprolol-Hydrochlorothiazide and Clonidine. jw

## 2014-04-13 ENCOUNTER — Other Ambulatory Visit: Payer: Self-pay | Admitting: *Deleted

## 2014-04-15 ENCOUNTER — Encounter: Payer: Self-pay | Admitting: Family Medicine

## 2014-04-15 ENCOUNTER — Ambulatory Visit (INDEPENDENT_AMBULATORY_CARE_PROVIDER_SITE_OTHER): Payer: 59 | Admitting: Family Medicine

## 2014-04-15 VITALS — BP 129/77 | HR 66 | Temp 98.1°F | Ht 61.0 in | Wt 158.0 lb

## 2014-04-15 DIAGNOSIS — I10 Essential (primary) hypertension: Secondary | ICD-10-CM

## 2014-04-15 DIAGNOSIS — R2241 Localized swelling, mass and lump, right lower limb: Secondary | ICD-10-CM | POA: Insufficient documentation

## 2014-04-15 NOTE — Assessment & Plan Note (Signed)
Hypersensitive subcutaneous nodule, ~63mm diameter on right lateral ankle, present for years but worsening recently, not infected - possible neuroma vs inflamed cyst - reassurance given - continue using aspercreme - if gets worse schedule visit to biopsy

## 2014-04-15 NOTE — Assessment & Plan Note (Signed)
Well controlled on bisoprolol-hctz and clonidine, 129/77 - refilled meds - no changes at this time - f/u in 6 months

## 2014-04-15 NOTE — Patient Instructions (Signed)
I think that your painful bump may be a nerve growth. It is not dangerous and you can continue using aspercreme to make it feel better. If it gets worse and you decide that you want it taken off, please schedule an appointment to do this.   Thank you

## 2014-04-15 NOTE — Progress Notes (Signed)
   Subjective:    Patient ID: Katie Humphrey, female    DOB: 12/11/1956, 59 y.o.   MRN: [id number]  HPI CHRONIC HYPERTENSION  Disease Monitoring  Blood pressure range: does not check  Chest pain: no   Dyspnea: no   Claudication: no   Medication compliance: yes, was out but just got refilled  Medication Side Effects  Lightheadedness: no   Urinary frequency: no   Edema: no   Impotence: no   Preventitive Healthcare:  Exercise: minimal   Diet Pattern: well balanced, also taking fish oil  Salt Restriction: sometimes  Has painful lump on right ankle. Present for many years. Hurts when touched even very lightly. Sharp pain. Also happens with prolonged standing. Shoots up lower leg. Worsening over the past month or so.  Review of Systems See HPI    Objective:   Physical Exam  Constitutional: She is oriented to person, place, and time. She appears well-developed and well-nourished. No distress.  HENT:  Head: Normocephalic and atraumatic.  Eyes: Conjunctivae are normal. Right eye exhibits no discharge. Left eye exhibits no discharge.  Cardiovascular: Normal rate, regular rhythm and normal heart sounds.   No murmur heard. Pulmonary/Chest: Effort normal and breath sounds normal. No respiratory distress. She has no wheezes.  Abdominal: Soft. She exhibits no distension. There is no tenderness.  Musculoskeletal: She exhibits no edema.  Neurological: She is alert and oriented to person, place, and time.  Skin: Skin is warm, dry and intact. No rash noted. She is not diaphoretic.     Psychiatric: She has a normal mood and affect. Her behavior is normal.  Nursing note and vitals reviewed.         Assessment & Plan:

## 2014-04-22 ENCOUNTER — Encounter: Payer: Self-pay | Admitting: Family Medicine

## 2014-04-30 ENCOUNTER — Encounter: Payer: Self-pay | Admitting: Family Medicine

## 2014-04-30 ENCOUNTER — Ambulatory Visit: Payer: Self-pay | Admitting: Family Medicine

## 2014-04-30 ENCOUNTER — Ambulatory Visit (INDEPENDENT_AMBULATORY_CARE_PROVIDER_SITE_OTHER): Payer: 59 | Admitting: Family Medicine

## 2014-04-30 VITALS — BP 120/75 | HR 72 | Temp 98.6°F | Ht 61.0 in | Wt 153.4 lb

## 2014-04-30 DIAGNOSIS — J309 Allergic rhinitis, unspecified: Secondary | ICD-10-CM

## 2014-04-30 MED ORDER — HYDROCODONE-HOMATROPINE 5-1.5 MG/5ML PO SYRP: 5.0000 mL | ORAL_SOLUTION | Freq: Three times a day (TID) | ORAL | Status: DC | PRN

## 2014-04-30 MED ORDER — LORATADINE 10 MG PO TABS: 10.0000 mg | ORAL_TABLET | Freq: Every day | ORAL | Status: DC

## 2014-04-30 MED ORDER — FLUTICASONE PROPIONATE 50 MCG/ACT NA SUSP: 2.0000 | Freq: Every day | NASAL | Status: DC

## 2014-04-30 NOTE — Assessment & Plan Note (Signed)
Patient symptoms likely secondary to allergic response and possible recent URI. Will treat conservatively with Hycodan, Flonase. I switched patient from Zyrtec to Claritin given lack of response. Patient to follow closely with PCP

## 2014-04-30 NOTE — Patient Instructions (Signed)
It was nice to see you today.   Your symptoms are consistent with allergies (you may also have a component of a viral infection).  I have prescribed Flonase, Claritin and a cough syrup for your symptoms.  Follow up closely with Dr. Sherril Cong.  Take care  Dr. Lacinda Axon

## 2014-04-30 NOTE — Progress Notes (Signed)
   Subjective:    Patient ID: Katie Humphrey, female    DOB: 12/11/1956, 59 y.o.   MRN: [id number]  HPI 59 year old female with a history of allergies presents for same day appointment with complete complaints of "feeling like crap".  Patient reports that she's been feeling poorly for the past week.  She's been experiencing productive cough, runny nose, itchy eyes, and nasal congestion with associated discomfort.  She denies any fevers or chills. She also denies any shortness of breath or rash. No known sick contacts. She's been taking Zyrtec with little relief in her symptoms.  Patient notes that her symptoms are exacerbated when she goes outdoors.  Patient feels that she has worsening allergies.  Review of Systems Per HPI    Objective:   Physical Exam Filed Vitals:   04/30/14 0933  BP: 120/75  Pulse: 72  Temp: 98.6 F (37 C)   Exam: General: appears fatigued; NAD.  HEENT: NCAT.  Normal TMs bilaterally. Oropharynx clear. Mild maxillary sinus tenderness. Nose - erythematous, mildly enlarged turbinates.  Cardiovascular: RRR. No murmurs, rubs, or gallops. Respiratory: CTAB. No rales, rhonchi, or wheeze.    Assessment & Plan:  See Problem List

## 2014-08-30 ENCOUNTER — Ambulatory Visit: Payer: Self-pay | Admitting: Family Medicine

## 2014-10-04 ENCOUNTER — Ambulatory Visit: Payer: Self-pay | Admitting: Family Medicine

## 2014-12-09 ENCOUNTER — Ambulatory Visit: Payer: Self-pay | Admitting: Family Medicine

## 2015-01-19 ENCOUNTER — Ambulatory Visit (INDEPENDENT_AMBULATORY_CARE_PROVIDER_SITE_OTHER): Payer: 59 | Admitting: Family Medicine

## 2015-01-19 ENCOUNTER — Encounter: Payer: Self-pay | Admitting: Family Medicine

## 2015-01-19 VITALS — BP 133/78 | HR 62 | Temp 98.0°F | Ht 61.0 in | Wt 149.1 lb

## 2015-01-19 DIAGNOSIS — M545 Low back pain, unspecified: Secondary | ICD-10-CM

## 2015-01-19 DIAGNOSIS — M549 Dorsalgia, unspecified: Secondary | ICD-10-CM

## 2015-01-19 HISTORY — DX: Dorsalgia, unspecified: M54.9

## 2015-01-19 MED ORDER — CYCLOBENZAPRINE HCL 10 MG PO TABS: 10.0000 mg | ORAL_TABLET | Freq: Three times a day (TID) | ORAL | Status: DC | PRN

## 2015-01-19 MED ORDER — NAPROXEN 500 MG PO TABS: 500.0000 mg | ORAL_TABLET | Freq: Two times a day (BID) | ORAL | Status: DC

## 2015-01-19 NOTE — Assessment & Plan Note (Signed)
Most likely secondary to either SI joint dysfunction versus muscle strain versus spasm No radiculopathy, saddle anesthesia, or bladder bowel dysfunction No injury and no prior history of similar symptoms - Naproxen sent for 14 days - Flexeril sent in and counseled on the initiation. Advised to start 1 pill at night for she understands how it affects her and then titrate up - Given home modalities - If no improvement in 4-6 weeks then follow-up. Consider at that time imaging and/or physical therapy versus referral to sports medicine for possible SI joint injection

## 2015-01-19 NOTE — Patient Instructions (Signed)
Thank you for coming in,   I have given you naproxen which is an anti-inflammatory that he can take for 2 weeks.  I'll also given Flexeril which she can take once at night to see how it affects you. It can make you drowsy so make sure not working the next day. He can take this up to 3 times per day.  Try this for 4-6 weeks and see if you have any improvement. Follow-up with no improvement we may need to consider formal physical therapy.  Please bring all of your medications with you to each visit.   Sign up for My Chart to have easy access to your labs results, and communication with your Primary care physician   Please feel free to call with any questions or concerns at any time, at [phone]. --Dr. Raeford Razor

## 2015-01-19 NOTE — Progress Notes (Signed)
   Subjective:    Patient ID: Katie Humphrey, female    DOB: 12/11/1956, 59 y.o.   MRN: [id number]  Seen for Same day visit for   CC: back pain   Back Pain: Location: lower back  Duration: two weeks  Quality: 5-8/10 Current Functional Status:  She works as a Chief Executive Officer and pain reproduced with picking things up or bending over Preceding Events: no falls. no trauma Alleviating Factors: Aleve with proven of her symptoms Exacerbating Factors: Bending over or lifting objects Hx of intervention: no surgery, no PT Hx of imaging: no Red Flags: no weakness, no numbness and tingling, no impaired bowel or bladder function   Review of Systems   See HPI for ROS. Objective:  BP 133/78 mmHg  Pulse 62  Temp(Src) 98 F (36.7 C) (Oral)  Ht 5\' 1"  (1.549 m)  Wt 149 lb 1.6 oz (67.631 kg)  BMI 28.19 kg/m2  General: NAD Neuro: alert and oriented, no focal deficits Back:  Appearance: No obvious deformity Palpation: Tender to palpation over the SI joints, no spinal tenderness Flexion: Flexion to 90 but with some pain Extension: Normal Hip:  Appearance: Obvious deformity Palpation: No tenderness to palpation of the greater trochanter Rotation normal internal and external FADIR: Normal FABER: Exacerbates pain bilaterally Neuro: Strength hip flexion 5/5,  knee extension 5/5, knee flexion 5/5, dorsiflexion 5/5, plantar flexion 5/5 Reflexes: patella 2/2 Bilateral  Achilles 2/2 Bilateral Straight Leg Raise: negative Sensation to light touch intact: yes     Assessment & Plan:   Back pain Most likely secondary to either SI joint dysfunction versus muscle strain versus spasm No radiculopathy, saddle anesthesia, or bladder bowel dysfunction No injury and no prior history of similar symptoms - Naproxen sent for 14 days - Flexeril sent in and counseled on the initiation. Advised to start 1 pill at night for she understands how it affects her and then titrate up - Given home  modalities - If no improvement in 4-6 weeks then follow-up. Consider at that time imaging and/or physical therapy versus referral to sports medicine for possible SI joint injection

## 2015-01-20 ENCOUNTER — Other Ambulatory Visit: Payer: Self-pay | Admitting: Family Medicine

## 2015-01-20 ENCOUNTER — Ambulatory Visit: Payer: Self-pay | Admitting: Family Medicine

## 2015-01-20 DIAGNOSIS — J309 Allergic rhinitis, unspecified: Secondary | ICD-10-CM

## 2015-01-20 DIAGNOSIS — I10 Essential (primary) hypertension: Secondary | ICD-10-CM

## 2015-01-20 MED ORDER — CLONIDINE HCL 0.1 MG PO TABS: 0.1000 mg | ORAL_TABLET | Freq: Every day | ORAL | Status: DC

## 2015-01-20 MED ORDER — BISOPROLOL-HYDROCHLOROTHIAZIDE 5-6.25 MG PO TABS: 1.0000 | ORAL_TABLET | Freq: Every day | ORAL | Status: DC

## 2015-01-20 MED ORDER — LORATADINE 10 MG PO TABS: 10.0000 mg | ORAL_TABLET | Freq: Every day | ORAL | Status: DC

## 2015-01-20 NOTE — Telephone Encounter (Signed)
Pt was seen yesterday by Dr. Raeford Razor, was told to call in for refill request, pt needs refills for clonidine, bisoprolol-hctz, and loratadine, pt goes to [facility]/[address]

## 2015-04-10 ENCOUNTER — Other Ambulatory Visit: Payer: Self-pay | Admitting: Family Medicine

## 2015-04-10 DIAGNOSIS — I10 Essential (primary) hypertension: Secondary | ICD-10-CM

## 2015-04-12 ENCOUNTER — Ambulatory Visit: Payer: Self-pay | Admitting: Family Medicine

## 2015-04-22 ENCOUNTER — Ambulatory Visit (INDEPENDENT_AMBULATORY_CARE_PROVIDER_SITE_OTHER): Payer: BLUE CROSS/BLUE SHIELD | Admitting: Internal Medicine

## 2015-04-22 ENCOUNTER — Encounter: Payer: Self-pay | Admitting: Internal Medicine

## 2015-04-22 VITALS — Temp 98.0°F | Wt 157.5 lb

## 2015-04-22 DIAGNOSIS — M79661 Pain in right lower leg: Secondary | ICD-10-CM

## 2015-04-22 DIAGNOSIS — M79669 Pain in unspecified lower leg: Secondary | ICD-10-CM | POA: Insufficient documentation

## 2015-04-22 DIAGNOSIS — R2241 Localized swelling, mass and lump, right lower limb: Secondary | ICD-10-CM | POA: Diagnosis not present

## 2015-04-22 MED ORDER — GABAPENTIN 100 MG PO CAPS: 100.0000 mg | ORAL_CAPSULE | Freq: Two times a day (BID) | ORAL | Status: DC

## 2015-04-22 NOTE — Assessment & Plan Note (Signed)
Worsening. Could be related to possible dermatofibroma, but less likely, as these are not usually tender. Given hyperesthesia of area, complex regional pain syndrome is a possibility. Description of pain sounds neuropathic in origin.  - Begin gabapentin 100mg  BID - F/u in one month with Dr. Sherril Cong - Consider increasing gabapentin if necessary

## 2015-04-22 NOTE — Assessment & Plan Note (Addendum)
Increased in size two months ago, but now stabilized. 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  - Could consider referral to derm clinic for removal, however it is unlikely that this is the source of her pain, so removal would likely not provide any pain relief. Would consider removing if lump begins to increase in size again.  - Continue to monitor

## 2015-04-22 NOTE — Patient Instructions (Addendum)
It was nice meeting you today, Ms. Trenchard.   For your leg pain, please begin taking gabapentin (brand name Neurontin) 100 mg twice a day.   We will see you back in 3-4 weeks to see if your pain is improving.   If you have any questions or concerns in the meantime, please feel free to call the office.   Be well,  Dr. Avon Gully

## 2015-04-22 NOTE — Progress Notes (Signed)
   Subjective:    Patient ID: Katie Humphrey, female    DOB: 12/11/1956, 60 y.o.   MRN: [id number]  HPI  Patient presents for same day visit for leg pain.   Leg pain Patient reports that she has had a small lump on her R lower leg for "a while," though it grew in size about two months ago. At that time, she began experiencing a sharp, needle-like pain at the site of the lump. The pain spreads to her knee in a straight line, but does not spread around her leg, below the lump, or past her knee. The pain persists for about 20 minutes. It is improved with massage. Patient thinks the pain occurs more frequently when she has been standing or walking more. Does not occur at any specific time of day. She has tried arthritis cream and Icy Hot with no relief in symptoms. Denies erythema or warmth. Endorses tenderness to palpation. No injuries to the area. No rashes. No numbness, weakness, or tingling.   Review of Systems See HPI.     Objective:   Physical Exam  Constitutional: She is oriented to person, place, and time. She appears well-developed and well-nourished. No distress.  HENT:  Head: Normocephalic and atraumatic.  Pulmonary/Chest: Effort normal. No respiratory distress.  Musculoskeletal:  Very small hard nodule on R lateral lower leg. Very TTP. No erythema, warmth, rash, or bruising noted. No TTP of other parts of leg. No edema. No abnormalities noted on L leg. No difficulties with ambulation. ROM not limited.    Neurological: She is alert and oriented to person, place, and time.  Skin: Skin is warm and dry.  Psychiatric: She has a normal mood and affect. Her behavior is normal.      Assessment & Plan:  Lower leg pain Worsening. Could be related to possible dermatofibroma, but less likely, as these are not usually tender. Given hyperesthesia of area, complex regional pain syndrome is a possibility. Description of pain sounds neuropathic in origin.  - Begin gabapentin 100mg  BID - F/u  in one month with Dr. Sherril Cong - Consider increasing gabapentin if necessary   Skin lump of leg Increased in size two months ago, but now stabilized. 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  - Could consider referral to derm clinic for removal, however it is unlikely that this is the source of her pain, so removal would likely not provide any pain relief. Would consider removing if lump begins to increase in size again.  - Continue to monitor   Adin Hector, MD PGY-1 [facility] Pager [phone]

## 2015-05-17 ENCOUNTER — Ambulatory Visit: Payer: Self-pay | Admitting: Family Medicine

## 2015-05-18 ENCOUNTER — Encounter: Payer: Self-pay | Admitting: Family Medicine

## 2015-05-18 ENCOUNTER — Ambulatory Visit
Admission: RE | Admit: 2015-05-18 | Discharge: 2015-05-18 | Disposition: A | Discharge status: Home / Self Care | Payer: BLUE CROSS/BLUE SHIELD | Source: Ambulatory Visit | Attending: Family Medicine | Admitting: Family Medicine

## 2015-05-18 ENCOUNTER — Ambulatory Visit (INDEPENDENT_AMBULATORY_CARE_PROVIDER_SITE_OTHER): Payer: BLUE CROSS/BLUE SHIELD | Admitting: Family Medicine

## 2015-05-18 VITALS — BP 154/82 | HR 57 | Temp 98.2°F | Ht 61.0 in | Wt 153.8 lb

## 2015-05-18 DIAGNOSIS — R2241 Localized swelling, mass and lump, right lower limb: Secondary | ICD-10-CM | POA: Diagnosis not present

## 2015-05-18 DIAGNOSIS — R072 Precordial pain: Secondary | ICD-10-CM | POA: Diagnosis not present

## 2015-05-18 DIAGNOSIS — R079 Chest pain, unspecified: Secondary | ICD-10-CM

## 2015-05-18 HISTORY — DX: Chest pain, unspecified: R07.9

## 2015-05-18 NOTE — Patient Instructions (Signed)
Nice to meet you today.  We will get some more information with ultrasound and XRay.  We will let you know the results when they are available.  Follow-up with PCP in 2 weeks.  Call cardiology and make follow-up appointment.  Take care, Dr. Jacinto Reap

## 2015-05-18 NOTE — Assessment & Plan Note (Signed)
Unclear etiology at this time Was previously told that this could be a dermatofibroma - but tenderness to palpation is out of proportion Concern for possible sarcoma Ultrasound and x-ray ordered today Consider referral to surgery for removal and pathology Follow-up with PCP in 2 weeks

## 2015-05-18 NOTE — Progress Notes (Signed)
   Subjective:   Katie Humphrey is a 60 y.o. female with a history of HTN, osteoarthritis, back pain, anxiety here for Right leg pain  Chest pain - Occurred once yesterday afternoon - was active and moving boxes when it occur - Lasted <2 min - sitting down and taking one dose of nitro helped - substernal sudden pain - reports this is similar to previous episodes of chest pain - last saw cardiology in 08/2013 - negative treadmill test - Not associated with nausea, vomiting, diaphoresis, shortness of breath. Did have mild dizziness  R leg pain - sharp pain - like electric shock - worse with standing or walking - doesn't get better with sitting - gotten worse this year - has been present for about one year and growing in size - has lump on lateral calf that is where pain starts and then spreads upwards - using icyhot, gabapentin 100mg  BID - not helping - Denies fevers, night sweats, redness, weight loss  Review of Systems:  Per HPI.   Social History: never smoker  Objective:  BP 154/82 mmHg  Pulse 57  Temp(Src) 98.2 F (36.8 C) (Oral)  Ht 5\' 1"  (1.549 m)  Wt 153 lb 12.8 oz (69.763 kg)  BMI 29.08 kg/m2  Gen:  60 y.o. female in NAD HEENT: NCAT, MMM CV: RRR, no MRG Resp: Non-labored, CTAB, no wheezes noted Ext: WWP, no edema, pea-sized hard nodule on right lateral lower leg. Non-mobile. Very TTP. No erythema or bruising noted.  MSK: Full ROM, strength intact in right lower extremity Neuro: Alert and oriented, speech normal, gait normal     Assessment & Plan:     Katie Humphrey is a 60 y.o. female here for  Mass of right lower leg Unclear etiology at this time Was previously told that this could be a dermatofibroma - but tenderness to palpation is out of proportion Concern for possible sarcoma Ultrasound and x-ray ordered today Consider referral to surgery for removal and pathology Follow-up with PCP in 2 weeks  Chest pain History consistent with stable  angina Continue nitroglycerin as needed Precautions including ongoing chest pain despite rest and nitroglycerin and pain coming at rest discussed Patient to make follow-up appointment with her cardiologist     Virginia Crews, MD MPH PGY-2,  [facility] 05/18/2015  12:01 PM

## 2015-05-18 NOTE — Assessment & Plan Note (Signed)
History consistent with stable angina Continue nitroglycerin as needed Precautions including ongoing chest pain despite rest and nitroglycerin and pain coming at rest discussed Patient to make follow-up appointment with her cardiologist

## 2015-05-19 ENCOUNTER — Telehealth: Payer: Self-pay | Admitting: Family Medicine

## 2015-05-19 NOTE — Telephone Encounter (Signed)
Attempted to call patient x2 to inform her of normal x-ray. No answer and no voicemail available. Please attempt to call the patient 1 more time. Thanks!  Virginia Crews, MD, MPH PGY-2,  [facility] 05/19/2015 3:07 PM

## 2015-05-19 NOTE — Telephone Encounter (Signed)
Pt informed. Deseree Blount, CMA  

## 2015-05-20 ENCOUNTER — Ambulatory Visit (HOSPITAL_COMMUNITY)
Admission: RE | Admit: 2015-05-20 | Discharge: 2015-05-20 | Disposition: A | Discharge status: Home / Self Care | Payer: BLUE CROSS/BLUE SHIELD | Source: Ambulatory Visit | Attending: Family Medicine | Admitting: Family Medicine

## 2015-05-20 ENCOUNTER — Other Ambulatory Visit: Payer: Self-pay | Admitting: Family Medicine

## 2015-05-20 DIAGNOSIS — R2241 Localized swelling, mass and lump, right lower limb: Secondary | ICD-10-CM

## 2015-05-21 ENCOUNTER — Telehealth: Payer: Self-pay | Admitting: Family Medicine

## 2015-05-21 DIAGNOSIS — R2241 Localized swelling, mass and lump, right lower limb: Secondary | ICD-10-CM

## 2015-05-21 NOTE — Telephone Encounter (Signed)
Called patient to discuss ultrasound results.  No answer and generic voicemail left asking patient to call back to the clinic. Ultrasound shows 7 mm hypoechoic nodule in the right lower extremity. I will refer the patient to surgery for further management and possible removal and pathology. If patient calls back please let her know the above.  Virginia Crews, MD, MPH PGY-2,  [facility] 05/21/2015 5:04 PM

## 2015-06-02 ENCOUNTER — Other Ambulatory Visit: Payer: Self-pay | Admitting: General Surgery

## 2015-06-02 ENCOUNTER — Other Ambulatory Visit: Payer: Self-pay

## 2015-06-20 ENCOUNTER — Ambulatory Visit (INDEPENDENT_AMBULATORY_CARE_PROVIDER_SITE_OTHER): Payer: BLUE CROSS/BLUE SHIELD | Admitting: Internal Medicine

## 2015-06-20 ENCOUNTER — Encounter: Payer: Self-pay | Admitting: Internal Medicine

## 2015-06-20 VITALS — BP 138/74 | HR 57 | Temp 98.2°F | Ht 61.0 in | Wt 156.1 lb

## 2015-06-20 DIAGNOSIS — M545 Low back pain, unspecified: Secondary | ICD-10-CM

## 2015-06-20 DIAGNOSIS — M79604 Pain in right leg: Secondary | ICD-10-CM

## 2015-06-20 DIAGNOSIS — R2241 Localized swelling, mass and lump, right lower limb: Secondary | ICD-10-CM | POA: Diagnosis not present

## 2015-06-20 LAB — COMPLETE METABOLIC PANEL WITH GFR
ALT: 10 U/L (ref 6–29)
AST: 18 U/L (ref 10–35)
Albumin: 4.7 g/dL (ref 3.6–5.1)
Alkaline Phosphatase: 66 U/L (ref 33–130)
BUN: 12 mg/dL (ref 7–25)
CO2: 30 mmol/L (ref 20–31)
Calcium: 10.1 mg/dL (ref 8.6–10.4)
Chloride: 103 mmol/L (ref 98–110)
Creat: 0.84 mg/dL (ref 0.50–1.05)
GFR, Est African American: 88 mL/min (ref >=60)
GFR, Est Non African American: 76 mL/min (ref >=60)
Glucose, Bld: 88 mg/dL (ref 65–99)
Potassium: 4.1 mmol/L (ref 3.5–5.3)
Sodium: 138 mmol/L (ref 135–146)
Total Bilirubin: 0.5 mg/dL (ref 0.2–1.2)
Total Protein: 8.2 g/dL — ABNORMAL HIGH (ref 6.1–8.1)

## 2015-06-20 LAB — CBC WITH DIFFERENTIAL/PLATELET
Basophils Absolute: 0 cells/uL (ref 0–200)
Basophils Relative: 0 %
Eosinophils Absolute: 186 cells/uL (ref 15–500)
Eosinophils Relative: 3 %
HCT: 40 % (ref 35.0–45.0)
Hemoglobin: 13.1 g/dL (ref 11.7–15.5)
Lymphocytes Relative: 37 %
Lymphs Abs: 2294 cells/uL (ref 850–3900)
MCH: 32.1 pg (ref 27.0–33.0)
MCHC: 32.8 g/dL (ref 32.0–36.0)
MCV: 98 fL (ref 80.0–100.0)
MPV: 10.4 fL (ref 7.5–12.5)
Monocytes Absolute: 434 cells/uL (ref 200–950)
Monocytes Relative: 7 %
Neutro Abs: 3286 cells/uL (ref 1500–7800)
Neutrophils Relative %: 53 %
Platelets: 240 10*3/uL (ref 140–400)
RBC: 4.08 MIL/uL (ref 3.80–5.10)
RDW: 13.5 % (ref 11.0–15.0)
WBC: 6.2 10*3/uL (ref 3.8–10.8)

## 2015-06-20 MED ORDER — NAPROXEN 500 MG PO TABS: 500.0000 mg | ORAL_TABLET | Freq: Two times a day (BID) | ORAL | Status: DC

## 2015-06-20 NOTE — Patient Instructions (Signed)
Ms. Allaway,  Thank you for coming in. I recommend x-ray of your lower back to look for issues with the bone, and labwork. I have ordered MRI of your right leg.   I have ordered an anti-inflammatory called naproxen to help with pain and made a referral for physical therapy.   Please return in 2 weeks to follow-up on your back pain and imaging results.  Best, Dr. Ola Spurr

## 2015-06-20 NOTE — Assessment & Plan Note (Signed)
-   Ordered x-ray of lumbar spine - Placed referral for PT - Ordered naproxen 500 mg BID, #60 - To determine need for further imaging or referrals based on x-ray results

## 2015-06-20 NOTE — Progress Notes (Signed)
   Subjective:    Patient ID: Katie Humphrey, female    DOB: 12/11/1956, 60 y.o.   MRN: [id number]  HPI  Katie Humphrey is a 60-y/o woman with history of hypertension, stable angina, right lower leg pain, and lower back pain. She presents for continued lower back pain.  Back pain: - Started again about 3 weeks ago. Describes as a soreness and tenderness in her spine.  - Worse with bending over, getting up out of the car (has to pull herself up with handle), and when she is not sitting up straight - At its worst the pain is an 8 out of 10 but is usually around a 6.  - Has tried ibuprofen, Icy Hot, and a back brace with little improvement - Sitting up very straight and putting a pillow behind her back for support have helped most  - Pain does not radiate anywhere and is located only in her lower back - No bowel or bladder incontinence - Was evaluated at the end of November 2016 and tried naproxen and flexeril   Right lower leg pain: - Has been seen by surgeon Dr. Marlou Starks at [facility] since last [facility] visit for this issue 05/18/15.  - Would like to have MRI scheduled, which was recommended after ultrasound that showed hypoechoic nodule with blood flow.  - Continues to have burning pain. Has minimal relief with lidocaine and icy hot.  - Especially concerned because she has a physical job as a Quarry manager and has had to pull over at times while driving due to pain and concern she would not be able to put on the brakes if needed.   Review of Systems  Constitutional: Negative for appetite change and unexpected weight change.  Musculoskeletal: Positive for back pain.  Skin: Negative for rash.  Neurological:       Negative for saddle anesthesia.      Objective:   Physical Exam  Constitutional: She appears well-developed and well-nourished. No distress.  Musculoskeletal:  Point tenderness over lumbar spine with less tenderness laterally. Minimal discomfort with lateral flexion. Pain with  forward flexion. Discomfort elicited in lower back with Faber's test. Small, pea-sized nodule over r. lateral mid-lower leg with pain out of proportion to exam.   Skin: Skin is warm and dry.  No discoloration or rashes over areas that are TTP.      Assessment & Plan:  Katie Humphrey is a 60-y.o. female with chief complaint of continued lower back pain. Symptoms and exam seem more consistent with causes of muscle strain or arthritis rather than neurologic such as nerve compression. Patient needs further work-up of right leg pain given concern for cancer.  Recommended patient return in about 2 weeks to re-assess symptoms.   Back pain - Ordered x-ray of lumbar spine - Placed referral for PT - Ordered naproxen 500 mg BID, #60 - To determine need for further imaging or referrals based on x-ray results  Mass of right lower leg - Ordered MRI of right lower leg with and without contrast, scheduled for 06/27/15 - Ordered CBC and CMP given concern for possible sarcoma and now with pain in lumbar spine   Olene Floss, MD [place], PGY-1

## 2015-06-20 NOTE — Assessment & Plan Note (Signed)
-   Ordered MRI of right lower leg with and without contrast, scheduled for 06/27/15 - Ordered CBC and CMP given concern for possible sarcoma and now with pain in lumbar spine

## 2015-06-22 ENCOUNTER — Ambulatory Visit (HOSPITAL_COMMUNITY)
Admission: RE | Admit: 2015-06-22 | Discharge: 2015-06-22 | Disposition: A | Discharge status: Home / Self Care | Payer: BLUE CROSS/BLUE SHIELD | Source: Ambulatory Visit | Attending: Family Medicine | Admitting: Family Medicine

## 2015-06-22 DIAGNOSIS — M545 Low back pain, unspecified: Secondary | ICD-10-CM

## 2015-06-22 DIAGNOSIS — K8020 Calculus of gallbladder without cholecystitis without obstruction: Secondary | ICD-10-CM | POA: Diagnosis not present

## 2015-06-23 ENCOUNTER — Telehealth: Payer: Self-pay | Admitting: Internal Medicine

## 2015-06-23 NOTE — Telephone Encounter (Signed)
Left message for patient to let her know recent labs and imaging were normal.

## 2015-06-27 ENCOUNTER — Ambulatory Visit (HOSPITAL_COMMUNITY): Payer: BLUE CROSS/BLUE SHIELD

## 2015-06-28 ENCOUNTER — Telehealth: Payer: Self-pay | Admitting: Family Medicine

## 2015-06-28 NOTE — Telephone Encounter (Signed)
Pt is calling because her insurance company denied paying for her MRI. She wants to know what she can do know or can we fight this with additional information so that they can pay for this. Please let patient know. [email or web address]

## 2015-06-29 ENCOUNTER — Ambulatory Visit: Payer: BLUE CROSS/BLUE SHIELD

## 2015-06-29 ENCOUNTER — Ambulatory Visit (HOSPITAL_COMMUNITY): Admission: RE | Admit: 2015-06-29 | Payer: BLUE CROSS/BLUE SHIELD | Source: Ambulatory Visit

## 2015-06-29 ENCOUNTER — Other Ambulatory Visit: Payer: Self-pay | Admitting: General Surgery

## 2015-06-29 DIAGNOSIS — R2241 Localized swelling, mass and lump, right lower limb: Secondary | ICD-10-CM

## 2015-06-29 NOTE — Telephone Encounter (Signed)
Patient's MRI was denied by insurance company due to needing more info regarding why pat needed this procedure.  Please contact [place] at [phone] [facility]) to discuss.

## 2015-06-29 NOTE — Telephone Encounter (Signed)
MRI was denied on our end because patient's surgeon, Dr. Marlou Starks, had already gotten the MRI authorized and [employer] will not allow two active authorizations for the same procedure. Spoke to Dr. Ola Spurr, she states patient advised her that she was having a lot of difficulties getting [facility] to schedule to the MRI and that is why she placed the MRI order. I also called [facility] and spoke to [place]. She sent a detailed message to Dr. Ethlyn Gallery nurse and they will hopefully get the MRI scheduled soon. Attempted to call patient, phone just rang. If she calls back pls inform her that since authorization is already approved with [employer] from Dr. Ethlyn Gallery office, they will need to be the one to schedule her MRI. She should hear something soon.

## 2015-07-01 NOTE — Telephone Encounter (Signed)
MRI scheduled for 07/06/15. Jazmin Hartsell,CMA

## 2015-07-04 ENCOUNTER — Other Ambulatory Visit: Payer: Self-pay | Admitting: Family Medicine

## 2015-07-04 DIAGNOSIS — Z1211 Encounter for screening for malignant neoplasm of colon: Secondary | ICD-10-CM

## 2015-07-06 ENCOUNTER — Ambulatory Visit
Admission: RE | Admit: 2015-07-06 | Discharge: 2015-07-06 | Disposition: A | Discharge status: Home / Self Care | Payer: BLUE CROSS/BLUE SHIELD | Source: Ambulatory Visit | Attending: General Surgery | Admitting: General Surgery

## 2015-07-06 DIAGNOSIS — R2241 Localized swelling, mass and lump, right lower limb: Secondary | ICD-10-CM

## 2015-07-06 MED ORDER — GADOBENATE DIMEGLUMINE 529 MG/ML IV SOLN
14.0000 mL | Freq: Once | INTRAVENOUS | Status: AC | PRN
  Administered 2015-07-06: 14 mL via INTRAVENOUS

## 2015-07-11 ENCOUNTER — Ambulatory Visit: Payer: Self-pay

## 2015-07-19 ENCOUNTER — Encounter: Payer: Self-pay | Admitting: Family Medicine

## 2015-07-19 ENCOUNTER — Ambulatory Visit (INDEPENDENT_AMBULATORY_CARE_PROVIDER_SITE_OTHER): Payer: BLUE CROSS/BLUE SHIELD | Admitting: Family Medicine

## 2015-07-19 VITALS — BP 128/80 | HR 72 | Temp 98.5°F | Wt 158.0 lb

## 2015-07-19 DIAGNOSIS — M545 Low back pain: Secondary | ICD-10-CM

## 2015-07-19 LAB — POCT URINALYSIS DIPSTICK
Bilirubin, UA: NEGATIVE
Blood, UA: NEGATIVE
Glucose, UA: NEGATIVE
Ketones, UA: NEGATIVE
Leukocytes, UA: NEGATIVE
Nitrite, UA: NEGATIVE
Protein, UA: NEGATIVE
Spec Grav, UA: 1.015
Urobilinogen, UA: 0.2
pH, UA: 7

## 2015-07-19 MED ORDER — BACLOFEN 10 MG PO TABS: 10.0000 mg | ORAL_TABLET | Freq: Three times a day (TID) | ORAL | Status: DC | PRN

## 2015-07-19 NOTE — Patient Instructions (Signed)
I am giving you a muscle relaxant for your back pain but it may make you sleep so be careful taking it during the day.  I am also referring you for physical therapy. It is important that you do your exercises regularly for them to be effective. They should contact you in the next few days to set up your first visit.

## 2015-07-21 NOTE — Assessment & Plan Note (Addendum)
Nonspecific LBP, negative xray, no radiculopathy, tender paraspinous musculature - already on nsaids for 4 weeks without significant improvement - refer for PT, stressed the importance of compliance with home exercises - add baclofen for spasm component, cautioned about drowsiness - f/u in 6 weeks

## 2015-07-21 NOTE — Progress Notes (Signed)
   Subjective:   Katie Humphrey is a 60 y.o. female with a history of OA, htn here for f/u back pain  BACK PAIN  Back pain began 2 months ago. Seen for it 4 weeks ago with negative lumbar xray at that time and started on naproxen Pain is described as dull ache. Patient has tried naproxen, tylenol. Pain radiates: no History of trauma or injury: no Prior history of similar pain: no History of cancer: no Weak immune system:  no History of IV drug use: no History of steroid use: no  Symptoms Incontinence of bowel or bladder:  no Numbness of leg: no Fever: no Rest or Night pain: no Weight Loss:  no* Rash: no  Patient believes arthritis might be causing their pain.  ROS see HPI Smoking Status noted.   Objective:  BP 128/80 mmHg  Pulse 72  Temp(Src) 98.5 F (36.9 C) (Oral)  Wt 158 lb (71.668 kg)  Gen:  60 y.o. female in NAD HEENT: NCAT, MMM, anicteric sclerae CV: RRR, no MRG Resp: Non-labored, CTAB, no wheezes noted Abd: Soft, NTND, BS present, no guarding or organomegaly Ext: WWP, no edema MSK: Low back with bilateral paraspinous tenderness and spasm, no midline tenderness, normal ROM, neg straight leg raise, normal lower extremity strength and sensation Neuro: Alert and oriented, speech normal    Assessment & Plan:     Katie Humphrey is a 60 y.o. female here for back pain  Back pain Nonspecific LBP, negative xray, no radiculopathy, tender paraspinous musculature - already on nsaids for 4 weeks without significant improvement - refer for PT, stressed the importance of compliance with home exercises - add baclofen for spasm component, cautioned about drowsiness - f/u in 6 weeks       Beverlyn Roux, MD, MPH [facility] PGY-3 07/21/2015 2:53 PM

## 2015-07-25 ENCOUNTER — Other Ambulatory Visit: Payer: Self-pay | Admitting: General Surgery

## 2015-07-27 ENCOUNTER — Encounter (HOSPITAL_COMMUNITY): Payer: Self-pay | Admitting: *Deleted

## 2015-07-27 NOTE — Progress Notes (Signed)
Pt states she had chest pain about 3 years ago and was followed by Dr. Ron Parker up until 08/2013. States she was given NTG and only took 1 since she had them.   Stress test 06/16/13 - in [facility]

## 2015-07-28 ENCOUNTER — Encounter (HOSPITAL_COMMUNITY)
Admission: RE | Disposition: A | Discharge status: Home / Self Care | Payer: Self-pay | Source: Ambulatory Visit | Attending: General Surgery

## 2015-07-28 ENCOUNTER — Encounter (HOSPITAL_COMMUNITY): Payer: Self-pay | Admitting: *Deleted

## 2015-07-28 ENCOUNTER — Ambulatory Visit (HOSPITAL_COMMUNITY): Payer: BLUE CROSS/BLUE SHIELD | Admitting: Certified Registered Nurse Anesthetist

## 2015-07-28 ENCOUNTER — Ambulatory Visit (HOSPITAL_COMMUNITY)
Admission: RE | Admit: 2015-07-28 | Discharge: 2015-07-28 | Disposition: A | Discharge status: Home / Self Care | Payer: BLUE CROSS/BLUE SHIELD | Source: Ambulatory Visit | Attending: General Surgery | Admitting: General Surgery

## 2015-07-28 DIAGNOSIS — I10 Essential (primary) hypertension: Secondary | ICD-10-CM | POA: Insufficient documentation

## 2015-07-28 DIAGNOSIS — Z79899 Other long term (current) drug therapy: Secondary | ICD-10-CM | POA: Insufficient documentation

## 2015-07-28 DIAGNOSIS — R2241 Localized swelling, mass and lump, right lower limb: Secondary | ICD-10-CM | POA: Diagnosis present

## 2015-07-28 DIAGNOSIS — D2122 Benign neoplasm of connective and other soft tissue of left lower limb, including hip: Secondary | ICD-10-CM | POA: Diagnosis not present

## 2015-07-28 HISTORY — DX: Headache: R51

## 2015-07-28 HISTORY — DX: Headache, unspecified: R51.9

## 2015-07-28 HISTORY — PX: MASS EXCISION: SHX2000

## 2015-07-28 LAB — BASIC METABOLIC PANEL
Anion gap: 9 (ref 5–15)
BUN: 13 mg/dL (ref 6–20)
CO2: 26 mmol/L (ref 22–32)
Calcium: 9.8 mg/dL (ref 8.9–10.3)
Chloride: 105 mmol/L (ref 101–111)
Creatinine, Ser: 0.79 mg/dL (ref 0.44–1.00)
GFR calc Af Amer: >60 mL/min (ref >60)
GFR calc non Af Amer: >60 mL/min (ref >60)
Glucose, Bld: 89 mg/dL (ref 65–99)
Potassium: 3.6 mmol/L (ref 3.5–5.1)
Sodium: 140 mmol/L (ref 135–145)

## 2015-07-28 LAB — CBC
HCT: 38 % (ref 36.0–46.0)
Hemoglobin: 12.3 g/dL (ref 12.0–15.0)
MCH: 31.4 pg (ref 26.0–34.0)
MCHC: 32.4 g/dL (ref 30.0–36.0)
MCV: 96.9 fL (ref 78.0–100.0)
Platelets: 181 10*3/uL (ref 150–400)
RBC: 3.92 MIL/uL (ref 3.87–5.11)
RDW: 13.3 % (ref 11.5–15.5)
WBC: 5.1 10*3/uL (ref 4.0–10.5)

## 2015-07-28 SURGERY — EXCISION MASS
Anesthesia: General | Laterality: Right

## 2015-07-28 MED ORDER — MIDAZOLAM HCL 2 MG/2ML IJ SOLN
INTRAMUSCULAR | Status: AC
  Filled 2015-07-28: qty 2

## 2015-07-28 MED ORDER — ONDANSETRON HCL 4 MG/2ML IJ SOLN
INTRAMUSCULAR | Status: AC
  Filled 2015-07-28: qty 2

## 2015-07-28 MED ORDER — BUPIVACAINE-EPINEPHRINE 0.25% -1:200000 IJ SOLN
INTRAMUSCULAR | Status: DC | PRN
  Administered 2015-07-28: 5.5 mL

## 2015-07-28 MED ORDER — CHLORHEXIDINE GLUCONATE 4 % EX LIQD: 1.0000 "application " | Freq: Once | CUTANEOUS | Status: DC

## 2015-07-28 MED ORDER — FENTANYL CITRATE (PF) 250 MCG/5ML IJ SOLN
INTRAMUSCULAR | Status: AC
  Filled 2015-07-28: qty 5

## 2015-07-28 MED ORDER — LACTATED RINGERS IV SOLN
INTRAVENOUS | Status: DC
  Administered 2015-07-28 (×2): via INTRAVENOUS

## 2015-07-28 MED ORDER — LIDOCAINE HCL (CARDIAC) 20 MG/ML IV SOLN
INTRAVENOUS | Status: DC | PRN
  Administered 2015-07-28: 40 mg via INTRAVENOUS

## 2015-07-28 MED ORDER — LIDOCAINE 2% (20 MG/ML) 5 ML SYRINGE
INTRAMUSCULAR | Status: AC
  Filled 2015-07-28: qty 5

## 2015-07-28 MED ORDER — CEFAZOLIN SODIUM-DEXTROSE 2-4 GM/100ML-% IV SOLN
2.0000 g | INTRAVENOUS | Status: AC
  Administered 2015-07-28: 2 g via INTRAVENOUS
  Filled 2015-07-28: qty 100

## 2015-07-28 MED ORDER — PROPOFOL 10 MG/ML IV BOLUS
INTRAVENOUS | Status: AC
  Filled 2015-07-28: qty 20

## 2015-07-28 MED ORDER — PROPOFOL 10 MG/ML IV BOLUS
INTRAVENOUS | Status: DC | PRN
Start: 2015-07-28 — End: 2015-07-28
  Administered 2015-07-28: 180 mg via INTRAVENOUS

## 2015-07-28 MED ORDER — 0.9 % SODIUM CHLORIDE (POUR BTL) OPTIME
TOPICAL | Status: DC | PRN
  Administered 2015-07-28: 1000 mL

## 2015-07-28 MED ORDER — ONDANSETRON HCL 4 MG/2ML IJ SOLN
INTRAMUSCULAR | Status: DC | PRN
  Administered 2015-07-28: 4 mg via INTRAVENOUS

## 2015-07-28 MED ORDER — FENTANYL CITRATE (PF) 100 MCG/2ML IJ SOLN
INTRAMUSCULAR | Status: DC | PRN
  Administered 2015-07-28: 50 ug via INTRAVENOUS

## 2015-07-28 MED ORDER — OXYCODONE-ACETAMINOPHEN 5-325 MG PO TABS: 1.0000 | ORAL_TABLET | ORAL | Status: DC | PRN

## 2015-07-28 MED ORDER — BUPIVACAINE HCL (PF) 0.25 % IJ SOLN
INTRAMUSCULAR | Status: AC
  Filled 2015-07-28: qty 30

## 2015-07-28 MED ORDER — MIDAZOLAM HCL 5 MG/5ML IJ SOLN
INTRAMUSCULAR | Status: DC | PRN
  Administered 2015-07-28: 2 mg via INTRAVENOUS

## 2015-07-28 SURGICAL SUPPLY — 33 items
CANISTER SUCTION 2500CC (MISCELLANEOUS) IMPLANT
CHLORAPREP W/TINT 26ML (MISCELLANEOUS) ×2 IMPLANT
COVER SURGICAL LIGHT HANDLE (MISCELLANEOUS) ×2 IMPLANT
DECANTER SPIKE VIAL GLASS SM (MISCELLANEOUS) IMPLANT
DRAPE LAPAROTOMY T 98X78 PEDS (DRAPES) ×2 IMPLANT
DRAPE UTILITY XL STRL (DRAPES) ×4 IMPLANT
ELECT CAUTERY BLADE 6.4 (BLADE) ×2 IMPLANT
ELECT REM PT RETURN 9FT ADLT (ELECTROSURGICAL) ×2
ELECTRODE REM PT RTRN 9FT ADLT (ELECTROSURGICAL) ×1 IMPLANT
GAUZE SPONGE 4X4 12PLY STRL (GAUZE/BANDAGES/DRESSINGS) IMPLANT
GLOVE BIO SURGEON STRL SZ7.5 (GLOVE) ×2 IMPLANT
GOWN STRL REUS W/ TWL LRG LVL3 (GOWN DISPOSABLE) ×2 IMPLANT
GOWN STRL REUS W/TWL LRG LVL3 (GOWN DISPOSABLE) ×4
KIT BASIN OR (CUSTOM PROCEDURE TRAY) ×2 IMPLANT
KIT ROOM TURNOVER OR (KITS) ×2 IMPLANT
LIQUID BAND (GAUZE/BANDAGES/DRESSINGS) ×2 IMPLANT
NDL HYPO 25X1 1.5 SAFETY (NEEDLE) ×1 IMPLANT
NEEDLE HYPO 25X1 1.5 SAFETY (NEEDLE) ×2 IMPLANT
NS IRRIG 1000ML POUR BTL (IV SOLUTION) ×2 IMPLANT
PACK SURGICAL SETUP 50X90 (CUSTOM PROCEDURE TRAY) ×2 IMPLANT
PAD ARMBOARD 7.5X6 YLW CONV (MISCELLANEOUS) ×2 IMPLANT
PENCIL BUTTON HOLSTER BLD 10FT (ELECTRODE) ×2 IMPLANT
SPECIMEN JAR SMALL (MISCELLANEOUS) ×2 IMPLANT
SPONGE LAP 18X18 X RAY DECT (DISPOSABLE) ×2 IMPLANT
SUT MNCRL AB 4-0 PS2 18 (SUTURE) ×2 IMPLANT
SUT VIC AB 3-0 SH 27 (SUTURE) ×2
SUT VIC AB 3-0 SH 27XBRD (SUTURE) ×1 IMPLANT
SYR BULB 3OZ (MISCELLANEOUS) ×2 IMPLANT
SYR CONTROL 10ML LL (SYRINGE) ×2 IMPLANT
TOWEL OR 17X24 6PK STRL BLUE (TOWEL DISPOSABLE) ×2 IMPLANT
TOWEL OR 17X26 10 PK STRL BLUE (TOWEL DISPOSABLE) ×2 IMPLANT
TUBE CONNECTING 12X1/4 (SUCTIONS) ×1 IMPLANT
YANKAUER SUCT BULB TIP NO VENT (SUCTIONS) ×1 IMPLANT

## 2015-07-28 NOTE — Transfer of Care (Signed)
Immediate Anesthesia Transfer of Care Note  Patient: Katie Humphrey  Procedure(s) Performed: Procedure(s): EXCISION MASS RIGHT LEG  (Right)  Patient Location: PACU  Anesthesia Type:General  Level of Consciousness: awake, patient cooperative and responds to stimulation  Airway & Oxygen Therapy: Patient Spontanous Breathing and Patient connected to nasal cannula oxygen  Post-op Assessment: Report given to RN and Post -op Vital signs reviewed and stable  Post vital signs: Reviewed and stable  Last Vitals:  Filed Vitals:   07/28/15 0847  BP: 176/96  Pulse: 73  Temp: 36.8 C  Resp: 20    Last Pain: There were no vitals filed for this visit.    Patients Stated Pain Goal: 3 ([id number])  Complications: No apparent anesthesia complications

## 2015-07-28 NOTE — H&P (Signed)
Katie Humphrey  Location: [facility] Patient #: [id number] DOB: 12/11/1956 Married / Language: English / Race: Black or African American Female   History of Present Illness  The patient is a 60 year old female who presents with a complaint of Mass. We are asked to see the patient in consultation by Dr. Lavon Paganini to evaluate her for a mass of the right leg. The patient is a 60 year old black female who has noticed this mass on her outer aspect of her right lower leg for the last 2 years. Over the last year she feels as though it is gotten more painful and a little bit larger. She denies any weakness in her foot or numbness in the foot. She did have an ultrasound which suggested some internal bloodflow in the radiology recommendation is to evaluate this further with MRI study   Other Problems  Arthritis Back Pain Gastroesophageal Reflux Disease High blood pressure  Past Surgical History  No pertinent past surgical history  Diagnostic Studies History  Mammogram 1-3 years ago Pap Smear 1-5 years ago  Allergies  No Known Drug Allergies  Medication History Bisoprolol-Hydrochlorothiazide (5-6.25MG  Tablet, Oral) Active. CloNIDine HCl (0.1MG  Tablet, Oral) Active. Medications Reconciled  Social History  Alcohol use Occasional alcohol use. Caffeine use Coffee. No drug use Tobacco use Never smoker.  Family History Heart Disease Brother, Mother, Sister. Heart disease in female family member before age 17 Heart disease in female family member before age 17 Hypertension Brother, Daughter, Family Members In [place], Sister. Migraine Headache Sister. Rectal Cancer Brother.  Pregnancy / Birth History  Age at menarche 29 years. Age of menopause 58-55 Gravida 31 Maternal age 50-20 Para 15    Review of Systems General Not Present- Appetite Loss, Chills, Fatigue, Fever, Night Sweats, Weight Gain and Weight Loss. HEENT Present-  Seasonal Allergies and Visual Disturbances. Not Present- Earache, Hearing Loss, Hoarseness, Nose Bleed, Oral Ulcers, Ringing in the Ears, Sinus Pain, Sore Throat, Wears glasses/contact lenses and Yellow Eyes. Respiratory Not Present- Bloody sputum, Chronic Cough, Difficulty Breathing, Snoring and Wheezing. Breast Not Present- Breast Mass, Breast Pain, Nipple Discharge and Skin Changes. Gastrointestinal Not Present- Abdominal Pain, Bloating, Bloody Stool, Change in Bowel Habits, Chronic diarrhea, Constipation, Difficulty Swallowing, Excessive gas, Gets full quickly at meals, Hemorrhoids, Indigestion, Nausea, Rectal Pain and Vomiting. Female Genitourinary Not Present- Frequency, Nocturia, Painful Urination, Pelvic Pain and Urgency. Psychiatric Present- Frequent crying. Not Present- Anxiety, Bipolar, Change in Sleep Pattern, Depression and Fearful. Endocrine Present- Hot flashes. Not Present- Cold Intolerance, Excessive Hunger, Hair Changes, Heat Intolerance and New Diabetes.  Vitals  Weight: 156 lb Height: 61in Body Surface Area: 1.7 m Body Mass Index: 29.48 kg/m  Temp.: 97.47F  Pulse: 60 (Regular)  BP: 124/78 (Sitting, Left Arm, Standard)       Physical Exam  General Mental Status-Alert. General Appearance-Consistent with stated age. Hydration-Well hydrated. Voice-Normal.  Head and Neck Head-normocephalic, atraumatic with no lesions or palpable masses. Trachea-midline. Thyroid Gland Characteristics - normal size and consistency.  Eye Eyeball - Bilateral-Extraocular movements intact. Sclera/Conjunctiva - Bilateral-No scleral icterus.  Chest and Lung Exam Chest and lung exam reveals -quiet, even and easy respiratory effort with no use of accessory muscles and on auscultation, normal breath sounds, no adventitious sounds and normal vocal resonance. Inspection Chest Wall - Normal. Back - normal.  Cardiovascular Cardiovascular examination reveals  -normal heart sounds, regular rate and rhythm with no murmurs and normal pedal pulses bilaterally.  Abdomen Inspection Inspection of the abdomen reveals - No  Hernias. Skin - Scar - no surgical scars. Palpation/Percussion Palpation and Percussion of the abdomen reveal - Soft, Non Tender, No Rebound tenderness, No Rigidity (guarding) and No hepatosplenomegaly. Auscultation Auscultation of the abdomen reveals - Bowel sounds normal.  Neurologic Neurologic evaluation reveals -alert and oriented x 3 with no impairment of recent or remote memory. Mental Status-Normal.  Musculoskeletal Normal Exam - Left-Upper Extremity Strength Normal and Lower Extremity Strength Normal. Normal Exam - Right-Upper Extremity Strength Normal and Lower Extremity Strength Normal. Note: There is an approximate 5 mm palpable mass in the outer aspect of the right lower extremity. It seems mobile but is very tender and I cannot get a good feel on it secondary to her discomfort. There are no overlying skin changes. There is no numbness or weakness of the foot.   Lymphatic Head & Neck  General Head & Neck Lymphatics: Bilateral - Description - Normal. Axillary  General Axillary Region: Bilateral - Description - Normal. Tenderness - Non Tender. Femoral & Inguinal  Generalized Femoral & Inguinal Lymphatics: Bilateral - Description - Normal. Tenderness - Non Tender.    Assessment & Plan  MASS OF RIGHT LOWER LEG (R22.41) Impression: The patient appears to have a small painful mass in the outer aspect of the right lower extremity. The ultrasound suggested some internal bloodflow. The radiologist's recommendation is to further evaluate this with an MRI study. Given the proximity of the mass to where the peroneal nerve runs I would agree with getting the MRI. We will call her with the results of the study and then proceed accordingly. Current Plans MRI LOWER EXTREMITY W/CONTRAST SO:[id number]) Follow Up - Call [facility]  office after tests / studies done to discuss further plans   Signed by Luella Cook, MD

## 2015-07-28 NOTE — Anesthesia Postprocedure Evaluation (Signed)
Anesthesia Post Note  Patient: Katie Humphrey  Procedure(s) Performed: Procedure(s) (LRB): EXCISION MASS RIGHT LEG  (Right)  Patient location during evaluation: PACU Anesthesia Type: General Level of consciousness: awake, awake and alert and oriented Pain management: pain level controlled Vital Signs Assessment: post-procedure vital signs reviewed and stable Respiratory status: spontaneous breathing, nonlabored ventilation and respiratory function stable Cardiovascular status: blood pressure returned to baseline Anesthetic complications: no    Last Vitals:  Filed Vitals:   07/28/15 1240 07/28/15 1254  BP: 131/72 143/81  Pulse: 52 56  Temp: 36.8 C   Resp: 16 18    Last Pain: There were no vitals filed for this visit.               Shiro Ellerman COKER

## 2015-07-28 NOTE — Anesthesia Preprocedure Evaluation (Addendum)
Anesthesia Evaluation  Patient identified by MRN, date of birth, ID band Patient awake    Reviewed: Allergy & Precautions, NPO status , Patient's Chart, lab work & pertinent test results, reviewed documented beta blocker date and time   Airway Mallampati: II  TM Distance: >3 FB Neck ROM: Full    Dental  (+) Teeth Intact, Dental Advisory Given   Pulmonary neg pulmonary ROS,    breath sounds clear to auscultation       Cardiovascular hypertension, Pt. on home beta blockers and Pt. on medications  Rhythm:Regular Rate:Normal     Neuro/Psych Anxiety negative neurological ROS     GI/Hepatic negative GI ROS, Neg liver ROS,   Endo/Other  negative endocrine ROS  Renal/GU negative Renal ROS     Musculoskeletal  (+) Arthritis ,   Abdominal   Peds  Hematology negative hematology ROS (+)   Anesthesia Other Findings   Reproductive/Obstetrics                            Lab Results  Component Value Date   WBC 5.1 07/28/2015   HGB 12.3 07/28/2015   HCT 38.0 07/28/2015   MCV 96.9 07/28/2015   PLT 181 07/28/2015   Lab Results  Component Value Date   CREATININE 0.84 06/20/2015   BUN 12 06/20/2015   NA 138 06/20/2015   K 4.1 06/20/2015   CL 103 06/20/2015   CO2 30 06/20/2015    Anesthesia Physical Anesthesia Plan  ASA: II  Anesthesia Plan: General   Post-op Pain Management:    Induction: Intravenous  Airway Management Planned: LMA  Additional Equipment:   Intra-op Plan:   Post-operative Plan: Extubation in OR  Informed Consent: I have reviewed the patients History and Physical, chart, labs and discussed the procedure including the risks, benefits and alternatives for the proposed anesthesia with the patient or authorized representative who has indicated his/her understanding and acceptance.   Dental advisory given  Plan Discussed with: CRNA and Anesthesiologist  Anesthesia Plan  Comments:        Anesthesia Quick Evaluation

## 2015-07-28 NOTE — Op Note (Signed)
07/28/2015  11:35 AM  PATIENT:  Katie Humphrey  59 y.o. female  PRE-OPERATIVE DIAGNOSIS:  Mass right leg  POST-OPERATIVE DIAGNOSIS:  Mass right leg 1cm  PROCEDURE:  Procedure(s): EXCISION MASS RIGHT LEG  (Right)  SURGEON:  Surgeon(s) and Role:    * Jovita Kussmaul, MD - Primary  PHYSICIAN ASSISTANT:   ASSISTANTS: none   ANESTHESIA:   general  EBL:  Total I/O In: 500 [I.V.:500] Out: -   BLOOD ADMINISTERED:none  DRAINS: none   LOCAL MEDICATIONS USED:  MARCAINE     SPECIMEN:  Source of Specimen:  mass right leg  DISPOSITION OF SPECIMEN:  PATHOLOGY  COUNTS:  YES  TOURNIQUET:  * No tourniquets in log *  DICTATION: .Dragon Dictation   After informed consent was obtained the patient was brought to the operating room and placed in the supine position on the operating room table. After adequate induction of general anesthesia the patient's right leg was prepped with ChloraPrep, allowed to dry, and draped in usual sterile manner. An appropriate timeout was performed. The mass was palpable on the lower outer aspect of the right lower leg. The area around the mass was infiltrated with quarter percent Marcaine. An elliptical  Incision was made around the mass with a 15 blade knife. The mass was excised completely and the subcutaneous tissue sharply with the 15 blade knife.  The mass measured about 1 cm. It was very well-circumscribed and contained in the skin and subcutaneous tissue. The mass was sent to pathology for further evaluation. Hemostasis was achieved using the Bovie electrocautery. The incision was then closed with interrupted 4-0 Monocryl subcuticular stitches. Dermabond dressings were applied. The patient tolerated the procedure well. At the end of the case all needle sponge counts were correct. The patient was then awakened and taken to recovery in stable Condition.  PLAN OF CARE: Discharge to home after PACU  PATIENT DISPOSITION:  PACU - hemodynamically stable.   Delay  start of Pharmacological VTE agent (>24hrs) due to surgical blood loss or risk of bleeding: not applicable

## 2015-07-28 NOTE — Interval H&P Note (Signed)
History and Physical Interval Note:  07/28/2015 8:46 AM  Katie Humphrey  has presented today for surgery, with the diagnosis of Mass right leg  The various methods of treatment have been discussed with the patient and family. After consideration of risks, benefits and other options for treatment, the patient has consented to  Procedure(s): EXCISION MASS RIGHT LEG  (Right) as a surgical intervention .  The patient's history has been reviewed, patient examined, no change in status, stable for surgery.  I have reviewed the patient's chart and labs.  Questions were answered to the patient's satisfaction.     TOTH III,Shanitra Phillippi S

## 2015-07-29 ENCOUNTER — Encounter (HOSPITAL_COMMUNITY): Payer: Self-pay | Admitting: General Surgery

## 2015-08-12 ENCOUNTER — Telehealth: Payer: Self-pay | Admitting: Family Medicine

## 2015-08-12 NOTE — Telephone Encounter (Signed)
Patient asks PCP to complete medical forms. Please, follow up.

## 2015-08-12 NOTE — Telephone Encounter (Signed)
Forms placed in MD box.

## 2015-08-16 NOTE — Telephone Encounter (Signed)
Pt has an appt on Thurs June 29 and would like pick up the papers then

## 2015-08-17 NOTE — Telephone Encounter (Signed)
Form completed and returned to RN.

## 2015-08-17 NOTE — Telephone Encounter (Signed)
Patient informed that forms are complete and ready for pickup.  Derl Barrow, RN

## 2015-08-18 ENCOUNTER — Ambulatory Visit (INDEPENDENT_AMBULATORY_CARE_PROVIDER_SITE_OTHER): Payer: BLUE CROSS/BLUE SHIELD | Admitting: Family Medicine

## 2015-08-18 ENCOUNTER — Encounter: Payer: Self-pay | Admitting: Family Medicine

## 2015-08-18 VITALS — BP 137/76 | HR 63 | Temp 98.7°F | Ht 61.0 in | Wt 157.6 lb

## 2015-08-18 DIAGNOSIS — J309 Allergic rhinitis, unspecified: Secondary | ICD-10-CM | POA: Diagnosis not present

## 2015-08-18 DIAGNOSIS — I10 Essential (primary) hypertension: Secondary | ICD-10-CM | POA: Diagnosis not present

## 2015-08-18 DIAGNOSIS — M545 Low back pain: Secondary | ICD-10-CM

## 2015-08-19 MED ORDER — LORATADINE 10 MG PO TABS: 10.0000 mg | ORAL_TABLET | Freq: Every day | ORAL | Status: AC | PRN

## 2015-08-19 MED ORDER — BISOPROLOL-HYDROCHLOROTHIAZIDE 5-6.25 MG PO TABS: 1.0000 | ORAL_TABLET | Freq: Every day | ORAL | Status: DC

## 2015-08-19 MED ORDER — CLONIDINE HCL 0.1 MG PO TABS
0.1000 mg | ORAL_TABLET | Freq: Every day | ORAL | Status: DC
Start: 2015-08-19 — End: 2016-11-12

## 2015-08-19 NOTE — Assessment & Plan Note (Signed)
Well controled, 137/76 today - continue clonidine and bisprolol-hctz - f/u in 3 months with new md

## 2015-08-19 NOTE — Progress Notes (Signed)
   Subjective:   Katie Humphrey is a 60 y.o. female with a history of htn, recent excision of leg mass here for f/u htn  CHRONIC HYPERTENSION  Disease Monitoring  Blood pressure range: 120-150/70-80  Chest pain: no   Dyspnea: no   Claudication: no   Medication compliance: yes  Medication Side Effects  Lightheadedness: no   Urinary frequency: no   Edema: no   Impotence: no   Preventitive Healthcare:  Exercise: yes, walks regularly   Diet Pattern: tid  Salt Restriction: yes    Review of Systems:  Per HPI. All other systems reviewed and are negative.   PMH, PSH, Medications, Allergies, and FmHx reviewed and updated in EMR.  Social History: never smoker  Objective:  BP 137/76 mmHg  Pulse 63  Temp(Src) 98.7 F (37.1 C) (Oral)  Ht 5\' 1"  (1.549 m)  Wt 157 lb 9.6 oz (71.487 kg)  BMI 29.79 kg/m2  Gen:  60 y.o. female in NAD HEENT: NCAT, MMM, anicteric sclerae CV: RRR, no MRG Resp: Non-labored, CTAB, no wheezes noted Abd: Soft, NTND, BS present, no guarding or organomegaly Ext: WWP, no edema Neuro: Alert and oriented, speech normal    Assessment & Plan:     Katie Humphrey is a 60 y.o. female here for htn  Hypertension Well controled, 137/76 today - continue clonidine and bisprolol-hctz - f/u in 3 months with new md      Beverlyn Roux, MD, MPH [place] PGY-3 08/19/2015 5:41 PM

## 2015-10-07 ENCOUNTER — Ambulatory Visit: Payer: Self-pay | Admitting: Internal Medicine

## 2016-02-02 ENCOUNTER — Other Ambulatory Visit: Payer: Self-pay | Admitting: *Deleted

## 2016-02-02 ENCOUNTER — Ambulatory Visit (INDEPENDENT_AMBULATORY_CARE_PROVIDER_SITE_OTHER): Payer: BLUE CROSS/BLUE SHIELD | Admitting: Family Medicine

## 2016-02-02 DIAGNOSIS — I10 Essential (primary) hypertension: Secondary | ICD-10-CM

## 2016-02-02 MED ORDER — BISOPROLOL-HYDROCHLOROTHIAZIDE 5-6.25 MG PO TABS: 1.0000 | ORAL_TABLET | Freq: Every day | ORAL | 5 refills | Status: DC

## 2016-04-11 ENCOUNTER — Ambulatory Visit: Payer: Self-pay | Admitting: Family Medicine

## 2016-05-10 ENCOUNTER — Encounter: Payer: Self-pay | Admitting: Internal Medicine

## 2016-05-10 ENCOUNTER — Ambulatory Visit (INDEPENDENT_AMBULATORY_CARE_PROVIDER_SITE_OTHER): Payer: BLUE CROSS/BLUE SHIELD | Admitting: Internal Medicine

## 2016-05-10 VITALS — BP 130/82 | HR 67 | Temp 97.7°F | Ht 61.0 in | Wt 167.0 lb

## 2016-05-10 DIAGNOSIS — R079 Chest pain, unspecified: Secondary | ICD-10-CM | POA: Diagnosis not present

## 2016-05-10 DIAGNOSIS — M170 Bilateral primary osteoarthritis of knee: Secondary | ICD-10-CM

## 2016-05-10 DIAGNOSIS — M25562 Pain in left knee: Secondary | ICD-10-CM

## 2016-05-10 DIAGNOSIS — G8929 Other chronic pain: Secondary | ICD-10-CM | POA: Diagnosis not present

## 2016-05-10 NOTE — Progress Notes (Addendum)
[facility] Progress Note  Subjective:  Katie Humphrey is a 61 y.o. female with history of HTN and OA of L knee who presents for knee pain and chest pain.   #Knee pain - Both knees hurt but mostly L knee  - Pain is throbbing. Ibuprofen and icy hot help some. Walking makes it hurt more. Can feel like L knee is "slipping" sometimes but has never given out.  - Patient recently began working at [employer] on [employer] floor and has been doing a lot more walking. - Last knee x-ray in 2015, previously in 2007, both showing degenerative changes - Has never had steroid injection and hesitant to have performed - Has worn knee brace in past and thought has been helpful - No known trauma or falls ROS: No fevers  #Chest pain: - Occasional sharp, substernal sudden pain that lasts less than 1 minute - Had negative exercise tolerance test in 2015 - Has Rx for nitro but hasn't needed to use recently and thinks is expired  - Thinks might be related to anxiety ROS: No n/v, SOB  No Known Allergies  Objective: Blood pressure 130/82, pulse 67, temperature 97.7 F (36.5 C), temperature source Oral, height 5\' 1"  (1.549 m), weight 167 lb (75.8 kg), SpO2 98 %. Body mass index is 31.55 kg/m. Constitutional: Obese female in NAD Cardiovascular: RRR, S1, S2, no m/r/g.  Pulmonary/Chest: Effort normal and breath sounds normal. No respiratory distress. No TTP of chest wall. Musculoskeletal: Crepitus of both knees, L > R. No warmth or effusion evident of either knee. Negative posterior and anterior drawer tests bilaterally. Discomfort with internal and external rotation of L knee but no clicking. Good patellar stability bilaterally. Knees appear symmetric.  Skin: Skin is warm and dry. No rash noted.  Psychiatric: Somewhat anxious affect.   Vitals reviewed  Assessment/Plan: OA (osteoarthritis) of knee - Chronic with acute worsening with increased physical activity at new job - Offered  steroid injection but patient declines at this time - Recommended supportive treatment with ibuprofen/tylenol, ice and flexible knee brace if patient feels this would be helpful - Will refer to Sports Medicine to make sure no ligament damage with ultrasound, though negative McMurray's  - Discussed importance of weight loss on stress of knee joints  Chest pain - Continues to have brief episodes of sharp chest pain, consistent with previous episodes  - Will refill nitro  - Advised patient to call clinic if episodes are lasting longer than seconds and to see her Cardiologist  Follow-up prn.  Olene Floss, MD [place], PGY-2

## 2016-05-10 NOTE — Patient Instructions (Addendum)
Katie Humphrey,  Weight loss and continued supportive therapy (ice, ibuprofen) should help arthritis pain. Steroid injections are also an option to consider.  I am referring you to Sports Medicine to make sure you don't have any ligament damage. You will get a call about this within the next few days.  If you have chest pain that lasts longer than a couple minutes, please let me know.  Best, Dr. Ola Spurr

## 2016-05-12 MED ORDER — NITROGLYCERIN 0.3 MG SL SUBL: 0.3000 mg | SUBLINGUAL_TABLET | SUBLINGUAL | 0 refills | Status: DC | PRN

## 2016-05-12 NOTE — Assessment & Plan Note (Addendum)
-   Continues to have brief episodes of sharp chest pain, consistent with previous episodes  - Will refill nitro  - Advised patient to call clinic if episodes are lasting longer than seconds and to see her Cardiologist

## 2016-05-12 NOTE — Assessment & Plan Note (Addendum)
-   Chronic with acute worsening with increased physical activity at new job - Offered steroid injection but patient declines at this time - Recommended supportive treatment with ibuprofen/tylenol, ice and flexible knee brace if patient feels this would be helpful - Will refer to Sports Medicine to make sure no ligament damage with ultrasound, though negative McMurray's  - Discussed importance of weight loss on stress of knee joints

## 2016-05-16 ENCOUNTER — Encounter: Payer: Self-pay | Admitting: Family Medicine

## 2016-05-16 ENCOUNTER — Ambulatory Visit (INDEPENDENT_AMBULATORY_CARE_PROVIDER_SITE_OTHER): Payer: BLUE CROSS/BLUE SHIELD | Admitting: Family Medicine

## 2016-05-16 DIAGNOSIS — M170 Bilateral primary osteoarthritis of knee: Secondary | ICD-10-CM

## 2016-05-16 NOTE — Assessment & Plan Note (Signed)
No significant fluid on the knee and has some degenerative changes within the meniscus with mild changes on the medial and lateral joint line. - Try hinged knee brace. - She can take over-the-counter anti-inflammatories. - She'll follow-up in 4 weeks. If the pain is not improved at that time then can consider injection therapy

## 2016-05-16 NOTE — Progress Notes (Signed)
  Katie Humphrey - 61 y.o. female MRN [id number]  Date of birth: 12/11/1956  SUBJECTIVE:  Including CC & ROS.   Katie Humphrey is a 61 year old female that is presenting with left pain. She denies any inciting event. The pain has been occurring for about a month. Her knee feels heavy. The pain is occurring on the medial aspect of the knee. She has some swelling and feels like it may give way. She has tried icy hot and ibuprofen. The pain is worse with activity and walking on her shift. She has to wear a brace during work.  ROS: No unexpected weight loss, fever, chills, swelling, instability, numbness/tingling, redness, otherwise see HPI   HISTORY: Past Medical, Surgical, Social, and Family History Reviewed & Updated per EMR.   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  PHYSICAL EXAM:  VS: BP:(!) 154/81  HR: bpm  TEMP: ( )  RESP:   HT:5\' 1"  (154.9 cm)   WT:155 lb (70.3 kg)  BMI:29.3 PHYSICAL EXAM: Gen: NAD, alert, cooperative with exam, well-appearing HEENT: clear conjunctiva, EOMI CV:  no edema, capillary refill brisk,  Resp: non-labored, normal speech Skin: no rashes, normal turgor  Neuro: no gross deficits.  Psych:  alert and oriented Left Knee:  No obvious effusion. Some tenderness to palpation of the medial joint line. No tenderness to palpation of the lateral joint line. Normal knee flexion and extension. Normal strength resistance. Point valgus and varus stress testing. Some pain with patellar grind and patellar compression. Negative McMurray's test. Neurovascularly intact.  Limited ultrasound: left knee:  The suprapatellar pouch did not have any significant fluid. The Quad and patellar tendon were normal in long axis The medial meniscus had some  outpouching to suggest degenerative changes as well as spurring at the medial femoral condyle and spurring at the proximal tibial plateau. The lateral meniscus had some general changes in outpouching as well as spurring occurring at the lateral femoral condyle and the lateral proximal tibial plateau. The medial and lateral trochlea groove were normal in appearance.  Summary: Her findings are suggestive of degenerative meniscal changes and mild arthritic changes of the knee.  Ultrasound and interpretation by Clearance Coots, MD   ASSESSMENT & PLAN:   OA (osteoarthritis) of knee No significant fluid on the knee and has some degenerative changes within the meniscus with mild changes on the medial and lateral joint line. - Try hinged knee brace. - She can take over-the-counter anti-inflammatories. - She'll follow-up in 4 weeks. If the pain is not improved at that time then can consider injection therapy

## 2016-06-18 ENCOUNTER — Other Ambulatory Visit: Payer: Self-pay | Admitting: Internal Medicine

## 2016-06-18 DIAGNOSIS — R079 Chest pain, unspecified: Secondary | ICD-10-CM

## 2016-07-22 ENCOUNTER — Other Ambulatory Visit: Payer: Self-pay | Admitting: Internal Medicine

## 2016-07-22 DIAGNOSIS — R079 Chest pain, unspecified: Secondary | ICD-10-CM

## 2016-08-21 ENCOUNTER — Encounter (HOSPITAL_COMMUNITY): Payer: Self-pay | Admitting: Emergency Medicine

## 2016-08-21 ENCOUNTER — Emergency Department (HOSPITAL_COMMUNITY)
Admission: EM | Admit: 2016-08-21 | Discharge: 2016-08-21 | Disposition: A | Discharge status: Home / Self Care | Payer: BLUE CROSS/BLUE SHIELD | Attending: Emergency Medicine | Admitting: Emergency Medicine

## 2016-08-21 DIAGNOSIS — H1032 Unspecified acute conjunctivitis, left eye: Secondary | ICD-10-CM | POA: Insufficient documentation

## 2016-08-21 DIAGNOSIS — F419 Anxiety disorder, unspecified: Secondary | ICD-10-CM | POA: Insufficient documentation

## 2016-08-21 DIAGNOSIS — Y939 Activity, unspecified: Secondary | ICD-10-CM | POA: Diagnosis not present

## 2016-08-21 DIAGNOSIS — Y929 Unspecified place or not applicable: Secondary | ICD-10-CM | POA: Diagnosis not present

## 2016-08-21 DIAGNOSIS — Z79899 Other long term (current) drug therapy: Secondary | ICD-10-CM | POA: Diagnosis not present

## 2016-08-21 DIAGNOSIS — S0502XA Injury of conjunctiva and corneal abrasion without foreign body, left eye, initial encounter: Secondary | ICD-10-CM | POA: Diagnosis not present

## 2016-08-21 DIAGNOSIS — I10 Essential (primary) hypertension: Secondary | ICD-10-CM | POA: Insufficient documentation

## 2016-08-21 DIAGNOSIS — Y998 Other external cause status: Secondary | ICD-10-CM | POA: Insufficient documentation

## 2016-08-21 DIAGNOSIS — H579 Unspecified disorder of eye and adnexa: Secondary | ICD-10-CM | POA: Diagnosis present

## 2016-08-21 DIAGNOSIS — Y33XXXA Other specified events, undetermined intent, initial encounter: Secondary | ICD-10-CM | POA: Diagnosis not present

## 2016-08-21 DIAGNOSIS — H109 Unspecified conjunctivitis: Secondary | ICD-10-CM

## 2016-08-21 MED ORDER — TETRACAINE HCL 0.5 % OP SOLN
2.0000 [drp] | Freq: Once | OPHTHALMIC | Status: AC
  Administered 2016-08-21: 2 [drp] via OPHTHALMIC
  Filled 2016-08-21: qty 4

## 2016-08-21 MED ORDER — FLUORESCEIN SODIUM 0.6 MG OP STRP
1.0000 | ORAL_STRIP | Freq: Once | OPHTHALMIC | Status: AC
  Administered 2016-08-21: 1 via OPHTHALMIC
  Filled 2016-08-21: qty 1

## 2016-08-21 MED ORDER — POLYMYXIN B-TRIMETHOPRIM 10000-0.1 UNIT/ML-% OP SOLN
2.0000 [drp] | Freq: Four times a day (QID) | OPHTHALMIC | 0 refills | Status: DC
Start: 2016-08-21 — End: 2019-03-30

## 2016-08-21 NOTE — ED Triage Notes (Signed)
Pt. reports left eye redness/itching onset this evening , denies injury /no blurred vision .

## 2016-08-21 NOTE — ED Provider Notes (Signed)
TIME SEEN: 5:38 AM  CHIEF COMPLAINT: Left eye irritation  HPI: Patient is a 61 year old female with history of hypertension who presents to the emergency department with left eye redness, irritation, burning. No vision loss, blurry vision. She states that she felt like something could be in her eye but denies any injury or anything getting into her eye. She works here in the hospital.  No history of glaucoma. States she thinks she was told she had a cataract in this eye before. Does not wear glasses or contacts. No drainage from the eye.  ROS: See HPI Constitutional: no fever  Eyes: no drainage  ENT: no runny nose   Cardiovascular:  no chest pain  Resp: no SOB  GI: no vomiting GU: no dysuria Integumentary: no rash  Allergy: no hives  Musculoskeletal: no leg swelling  Neurological: no slurred speech ROS otherwise negative  PAST MEDICAL HISTORY/PAST SURGICAL HISTORY:  Past Medical History:  Diagnosis Date  . Anxiety 11/30/2011  . Headache    hx of migraines, stopped years ago  . Hypertension   . OA (osteoarthritis) of knee 11/30/2011    MEDICATIONS:  Prior to Admission medications   Medication Sig Start Date End Date Taking? Authorizing Provider  bisoprolol-hydrochlorothiazide (ZIAC) 5-6.25 MG tablet Take 1 tablet by mouth daily. 02/02/16   Rogue Bussing, MD  cloNIDine (CATAPRES) 0.1 MG tablet Take 1 tablet (0.1 mg total) by mouth at bedtime. 08/19/15   Frazier Richards, MD  FLUARIX QUADRIVALENT 0.5 ML injection TO BE ADMINISTERED BY PHARMACIST FOR IMMUNIZATION 04/03/16   [provider]  gabapentin (NEURONTIN) 100 MG capsule Take 1 capsule (100 mg total) by mouth 2 (two) times daily. 04/22/15   Verner Mould, MD  loratadine (CLARITIN) 10 MG tablet Take 1 tablet (10 mg total) by mouth daily as needed for allergies. 08/19/15   Frazier Richards, MD  nitroGLYCERIN (NITROSTAT) 0.3 MG SL tablet PLACE 1 TABLET UNDER THE TONGUE EVERY 5 MIN FOR CHEST PAIN.CALL 911  IF PAIN PERSISTS AFTER 3 TABLETS 06/18/16   Rogue Bussing, MD  Tetrahydrozoline HCl (VISINE OP) Place 1 drop into both eyes as needed (as needed for allergies).    [provider]    ALLERGIES:  No Known Allergies  SOCIAL HISTORY:  Social History  Substance Use Topics  . Smoking status: Never Smoker  . Smokeless tobacco: Never Used  . Alcohol use No    FAMILY HISTORY: Family History  Problem Relation Age of Onset  . COPD Mother   . CAD Mother   . Heart failure Father     EXAM: BP (!) 145/92 (BP Location: Left Arm)   Pulse 76   Temp 98.4 F (36.9 C) (Oral)   Resp 18   Ht 5\' 2"  (1.575 m)   Wt 73.5 kg (162 lb)   SpO2 96%   BMI 29.63 kg/m  CONSTITUTIONAL: Alert and oriented and responds appropriately to questions. Well-appearing; well-nourished HEAD: Normocephalic EYES: Conjunctiva clear on the right and injected on the left without drainage, pupils appear equal and are equally reactive to light without any pain with consensual light response, EOMI, pressure is 11 mmHg in the left eye, patient does have a very small corneal abrasion without foreign body noted to the lateral aspect just next to the iris of the left eye, no corneal ulcer, normal funduscopic exam, patient has some redness and very minimal swelling to the upper and lower eyelid without warmth ENT: normal nose; moist mucous membranes NECK: Supple,  no meningismus, no nuchal rigidity, no LAD  CARD: RRR; S1 and S2 appreciated; no murmurs, no clicks, no rubs, no gallops RESP: Normal chest excursion without splinting or tachypnea; breath sounds clear and equal bilaterally; no wheezes, no rhonchi, no rales, no hypoxia or respiratory distress, speaking full sentences ABD/GI: Normal bowel sounds; non-distended; soft, non-tender, no rebound, no guarding, no peritoneal signs, no hepatosplenomegaly BACK:  The back appears normal and is non-tender to palpation, there is no CVA tenderness EXT: Normal ROM in  all joints; non-tender to palpation; no edema; normal capillary refill; no cyanosis, no calf tenderness or swelling    SKIN: Normal color for age and race; warm; no rash NEURO: Moves all extremities equally PSYCH: The patient's mood and manner are appropriate. Grooming and personal hygiene are appropriate.  MEDICAL DECISION MAKING: Patient here with conjunctivitis and a small corneal abrasion. Corneal abrasion likely from rubbing her eye repetitively. She denies getting anything into her eye tonight. She states she did try to flush it with saline prior to coming to the ED. Intraocular pressure is normal. Normal visual acuity. No corneal ulcer. Normal funduscopic exam. Will discharge with Polytrim drops and outpatient ophthalmology follow-up. Discussed return precautions. Have advised that she stop rubbing her eyes. Recommended over-the-counter antihistamines as needed. No sign of facial cellulitis on exam.  At this time, I do not feel there is any life-threatening condition present. I have reviewed and discussed all results (EKG, imaging, lab, urine as appropriate) and exam findings with patient/family. I have reviewed nursing notes and appropriate previous records.  I feel the patient is safe to be discharged home without further emergent workup and can continue workup as an outpatient as needed. Discussed usual and customary return precautions. Patient/family verbalize understanding and are comfortable with this plan.  Outpatient follow-up has been provided if needed. All questions have been answered.       Ward, Delice Bison, DO 08/21/16 [phone]

## 2016-09-15 ENCOUNTER — Other Ambulatory Visit: Payer: Self-pay | Admitting: Internal Medicine

## 2016-09-15 DIAGNOSIS — R079 Chest pain, unspecified: Secondary | ICD-10-CM

## 2016-11-05 ENCOUNTER — Ambulatory Visit (INDEPENDENT_AMBULATORY_CARE_PROVIDER_SITE_OTHER): Payer: BLUE CROSS/BLUE SHIELD | Admitting: Internal Medicine

## 2016-11-05 ENCOUNTER — Encounter: Payer: Self-pay | Admitting: Internal Medicine

## 2016-11-05 VITALS — BP 130/80 | HR 60 | Ht 61.0 in | Wt 162.0 lb

## 2016-11-05 DIAGNOSIS — Z114 Encounter for screening for human immunodeficiency virus [HIV]: Secondary | ICD-10-CM | POA: Diagnosis not present

## 2016-11-05 DIAGNOSIS — M25562 Pain in left knee: Secondary | ICD-10-CM | POA: Diagnosis not present

## 2016-11-05 DIAGNOSIS — Z1159 Encounter for screening for other viral diseases: Secondary | ICD-10-CM | POA: Diagnosis not present

## 2016-11-05 DIAGNOSIS — G8929 Other chronic pain: Secondary | ICD-10-CM

## 2016-11-05 MED ORDER — NYSTATIN 100000 UNIT/GM EX OINT: 1.0000 "application " | TOPICAL_OINTMENT | Freq: Two times a day (BID) | CUTANEOUS | 1 refills | Status: DC

## 2016-11-05 MED ORDER — NAPROXEN 500 MG PO TABS: 500.0000 mg | ORAL_TABLET | Freq: Two times a day (BID) | ORAL | 0 refills | Status: DC

## 2016-11-05 NOTE — Progress Notes (Signed)
[facility] Progress Note  Subjective:  Katie Humphrey is a 61 y.o. female with history of HTN, OA of L knee, headaches and anxiety who presents for L knee pain. She has had increased pain for 1 day and thinks she has had swelling on outside of knee. Denies any recent injury. She denies noting redness. She rates the pain at a 5/10 and describes the pain as throbbing. She sometimes feels like she can hear popping when she walks. She has been wearing a soft knee brace at work and takes 400 mg ibuprofen twice a day with tylenol 500 mg prn; these help some. 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 She was seen by Sports Medicine earlier this year and was given a hinged knee brace but had difficulty fitting this under her work uniform. [place] at that time (05/16/16) showed degenerative changes and spurring at medial and lateral menisci.  ROS: No falls, no fevers  Social: Never smoker  No Known Allergies  Objective: Blood pressure 130/80, pulse 60, height 5\' 1"  (1.549 m), weight 162 lb (73.5 kg). Body mass index is 30.61 kg/m. Constitutional: Obese female in NAD  Musculoskeletal: No knee effusion noted. R and L knee appear symmetric. Bilateral crepitus. No increased warmth. Pain with internal rotation of L knee and with McMurray's. No alteration in gait. Reports some discomfort at front of knee with Thesally's.  Neurological: LE strength 5/5.  Skin: Skin is warm and dry. No rash noted. No erythema.  Psychiatric: Normal mood and affect.  Vitals reviewed  Assessment/Plan: Chronic pain of left knee - Acute flare x 1 day without evidence of effusion or disability of gait. Suspect strain and inflammation in setting of known OA. Lower suspicion for meniscal tear without distinct injury and [place] earlier this year showing spurring and degenerative changes.  - Offered knee steroid injection versus trial  of naproxen 500 mg BID x 7 days. Patient preferred trying naproxen. Counseled not to take with ibuprofen. Advised trying tylenol more in future due to cardiovascular risks of chronic NSAIDs. Try ice and elevation as able.  - Recommended PT to strengthen muscles surrounding L knee. Patient agreeable. Will place referral.  Health Maintenance: Will obtain HIV and Hep C screening today. Due for wellness exam.   Follow-up at earliest convenience for general check-up and pap smear. Return in 1-2 weeks if no improvement in knee pain.  Olene Floss, MD [place], PGY-3

## 2016-11-05 NOTE — Patient Instructions (Signed)
Katie Humphrey,  I will refer you to Physical Therapy to work on strengthening the muscles around your knee. Try naproxen 500 mg twice daily for a week to help with inflammation. Do not take ibuprofen at the same time. Elevating the knee and icing can also help.  If you aren't feeling better in the next couple of weeks, please see me back. We could consider a steroid injection.  Try nystatin ointment for possible yeast infection of buttocks. Vaseline may also provide some relief.  Please see me back at your earliest convenience for a general checkup. You are due for pap smear, mammogram, and colonoscopy. I will call you if any of your lab results today are abnormal.  Best, Dr. Ola Spurr

## 2016-11-06 DIAGNOSIS — G8929 Other chronic pain: Secondary | ICD-10-CM | POA: Insufficient documentation

## 2016-11-06 DIAGNOSIS — M25562 Pain in left knee: Principal | ICD-10-CM

## 2016-11-06 LAB — HEPATITIS C ANTIBODY: Hep C Virus Ab: <0.1 s/co ratio (ref 0.0–0.9)

## 2016-11-06 LAB — HIV ANTIBODY (ROUTINE TESTING W REFLEX): HIV Screen 4th Generation wRfx: NONREACTIVE

## 2016-11-06 NOTE — Assessment & Plan Note (Addendum)
-   Acute flare x 1 day without evidence of effusion or disability of gait. Suspect strain and inflammation in setting of known OA. Lower suspicion for meniscal tear without distinct injury and [place] earlier this year showing spurring and degenerative changes.  - Offered knee steroid injection versus trial of naproxen 500 mg BID x 7 days. Patient preferred trying naproxen. Counseled not to take with ibuprofen. Advised trying tylenol more in future due to cardiovascular risks of chronic NSAIDs. Try ice and elevation as able.  - Recommended PT to strengthen muscles surrounding L knee. Patient agreeable. Will place referral.

## 2016-11-07 ENCOUNTER — Encounter: Payer: Self-pay | Admitting: Internal Medicine

## 2016-11-12 ENCOUNTER — Other Ambulatory Visit: Payer: Self-pay | Admitting: Internal Medicine

## 2016-11-12 DIAGNOSIS — I10 Essential (primary) hypertension: Secondary | ICD-10-CM

## 2016-11-12 MED ORDER — BISOPROLOL-HYDROCHLOROTHIAZIDE 5-6.25 MG PO TABS: 1.0000 | ORAL_TABLET | Freq: Every day | ORAL | 3 refills | Status: DC

## 2016-11-12 NOTE — Telephone Encounter (Signed)
Patient called to request a 90 day supply.  Derl Barrow, RN

## 2016-12-10 ENCOUNTER — Other Ambulatory Visit: Payer: Self-pay | Admitting: *Deleted

## 2016-12-10 DIAGNOSIS — I10 Essential (primary) hypertension: Secondary | ICD-10-CM

## 2016-12-10 NOTE — Telephone Encounter (Signed)
Pt LM on nurse line.  She wants a 90 day supply of her meds.   She did not let me know what meds she needed.  Called pt back but had to LM.  Which meds does she need refilled since we did not receive a request from the pharmacy?   Rodrigus Kilker, Salome Spotted, CMA

## 2016-12-10 NOTE — Telephone Encounter (Signed)
Patient left message on nurse line requesting 90 day refill on clonidine and vistaril at [facility] on [address]. Vistaril not on current med list. Hubbard Hartshorn, RN, BSN

## 2016-12-11 MED ORDER — CLONIDINE HCL 0.1 MG PO TABS: 0.1000 mg | ORAL_TABLET | Freq: Every day | ORAL | 3 refills | Status: DC

## 2016-12-11 NOTE — Telephone Encounter (Signed)
Refilled clonidine. Have not prescribed patient vistaril in the past.

## 2017-04-25 ENCOUNTER — Ambulatory Visit: Payer: Self-pay | Admitting: Internal Medicine

## 2017-05-09 ENCOUNTER — Other Ambulatory Visit: Payer: Self-pay

## 2017-05-10 MED ORDER — NYSTATIN 100000 UNIT/GM EX OINT: 1.0000 "application " | TOPICAL_OINTMENT | Freq: Two times a day (BID) | CUTANEOUS | 1 refills | Status: DC

## 2017-05-14 ENCOUNTER — Ambulatory Visit: Payer: Self-pay | Admitting: Internal Medicine

## 2017-08-03 IMAGING — MR MR [PERSON_NAME] LOW WO/W CM*R*
4 of 8 series · 10 of 40 positions shown · IV contrast (MULTIHANCE)
Comparison: None.

CLINICAL DATA: Right lateral lower leg mass for 3 months.

EXAM:
MRI OF LOWER RIGHT EXTREMITY WITHOUT AND WITH CONTRAST
TECHNIQUE: Multiplanar, multisequence MR imaging of the right was performed
both before and after administration of intravenous contrast.
CONTRAST:  14mL MULTIHANCE GADOBENATE DIMEGLUMINE 529 MG/ML IV SOLN

[Series 10: T1 · coronal · right · 3.0mm · 0.65mm/px · 1 of 32 slices shown]
[im 7/32]
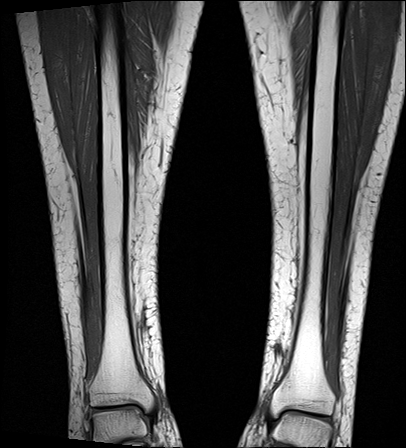

[Series 12: T2 fat-sat · axial · right · 6.0mm · 0.29mm/px · z∈[-210,+55]mm · 3 of 35 slices shown (1 of 3)]
[im 1/35]
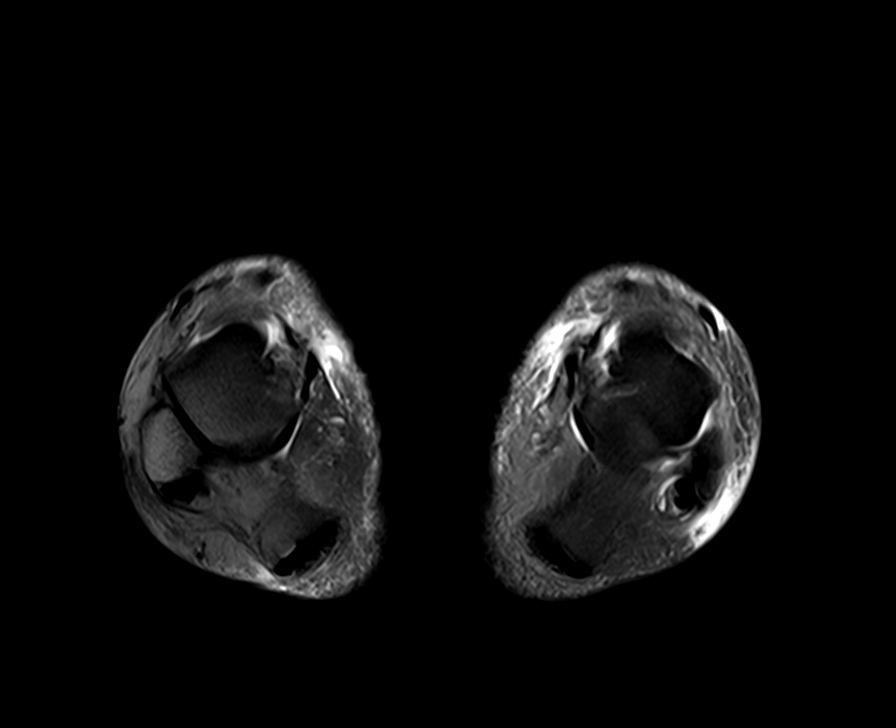
[im 18/35]
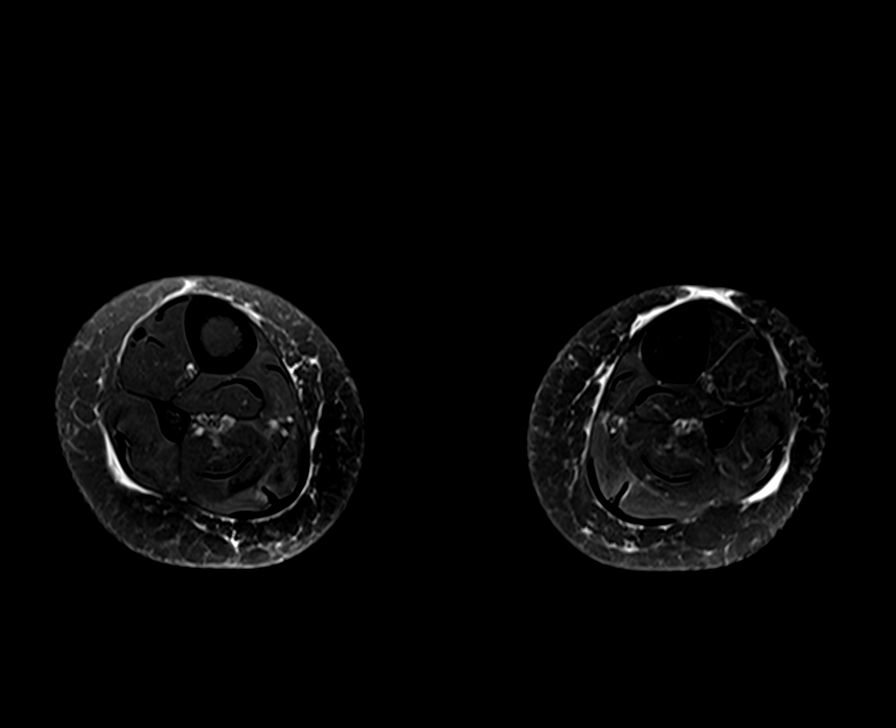
[im 35/35]
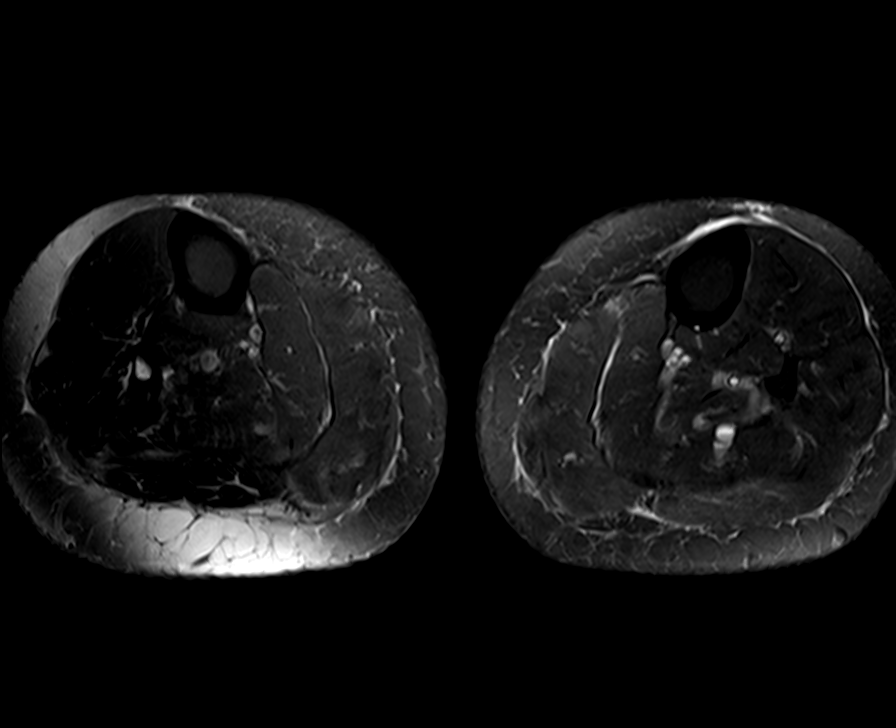

[Series 13: T2 fat-sat · coronal · right · 3.0mm · 0.32mm/px · 3 of 32 slices shown (2 of 3)]
[im 1/32]
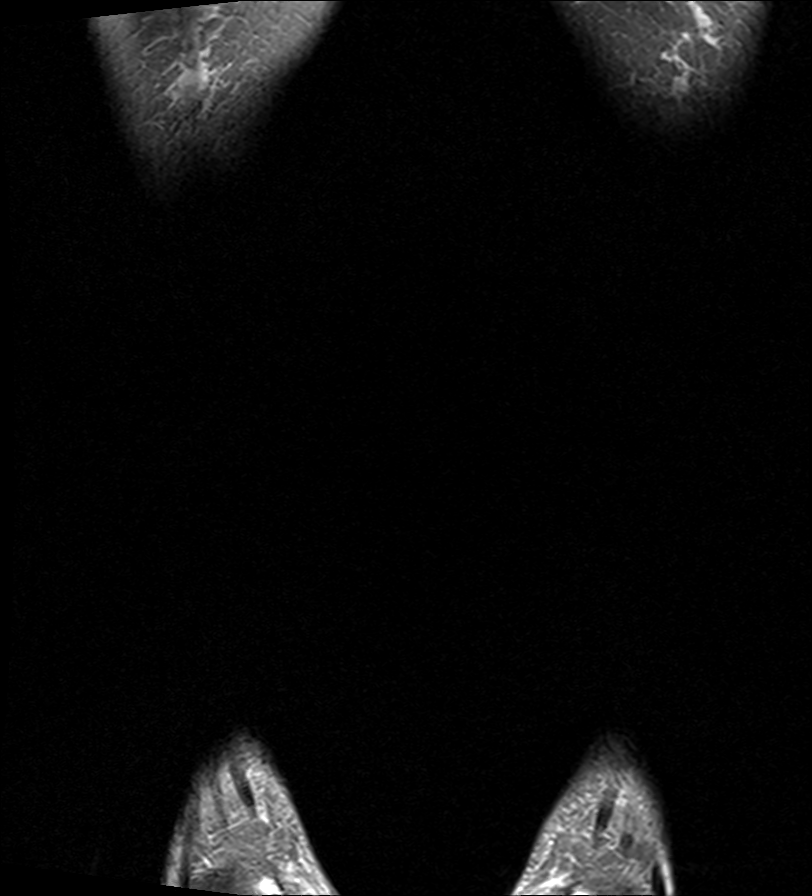
[im 16/32]
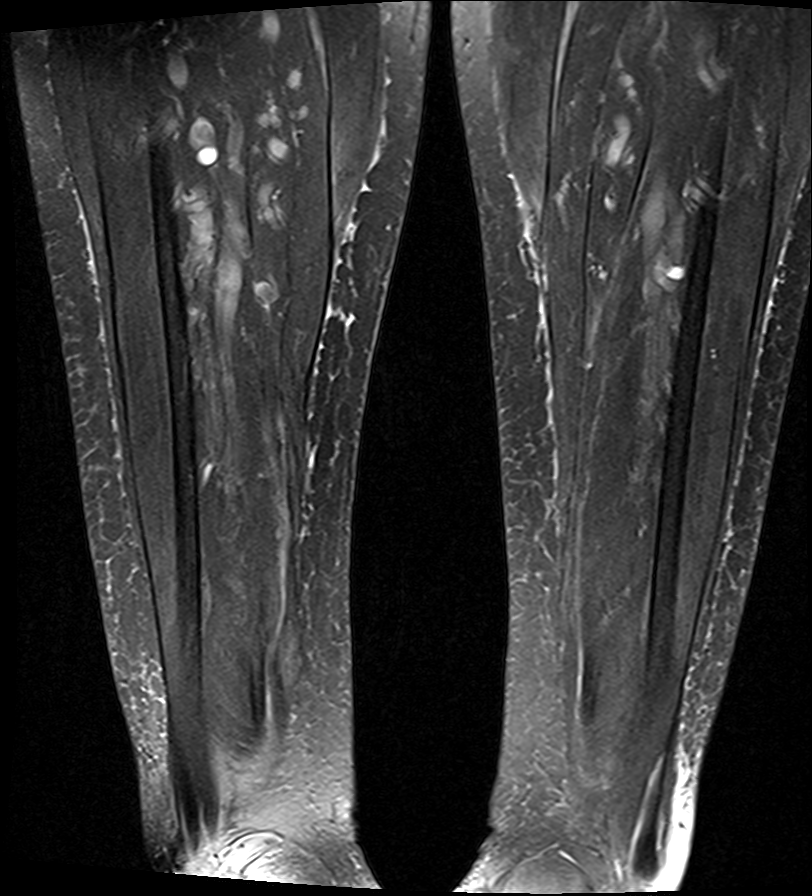
[im 32/32]
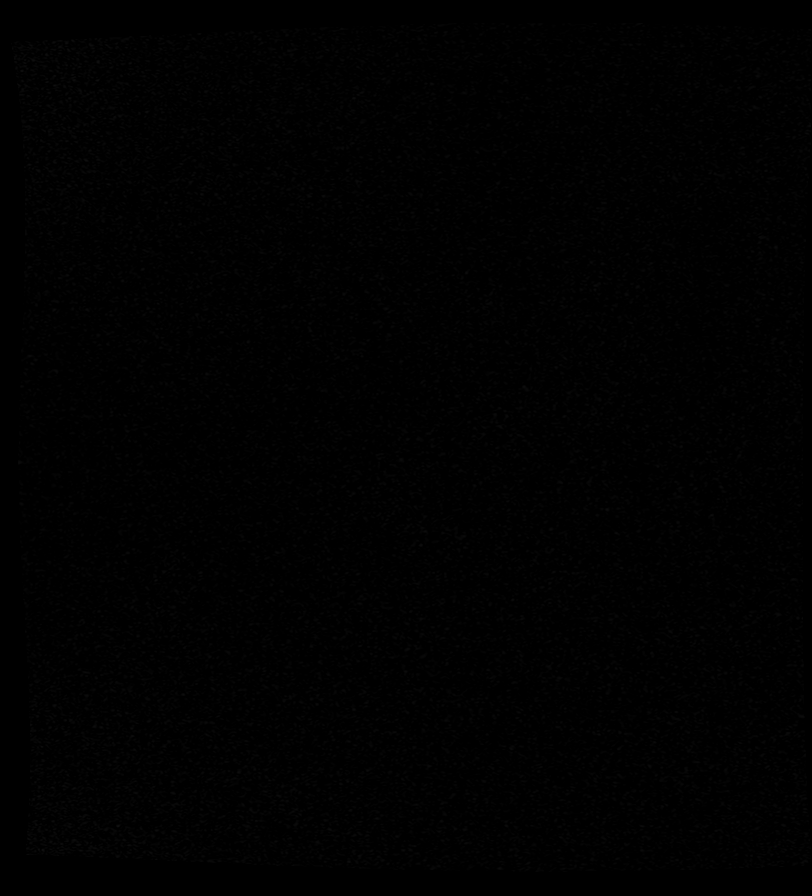

[Series 14: T2 fat-sat · sagittal · right · 3.0mm · 0.32mm/px · 3 of 30 slices shown (3 of 3)]
[im 1/30]
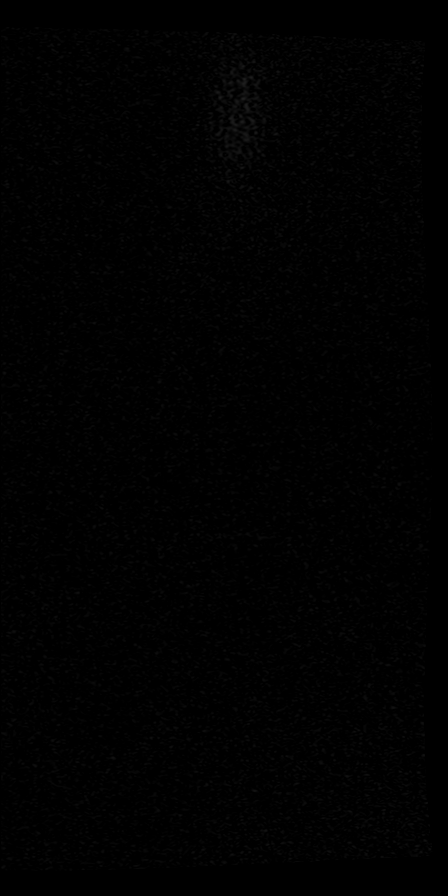
[im 15/30]
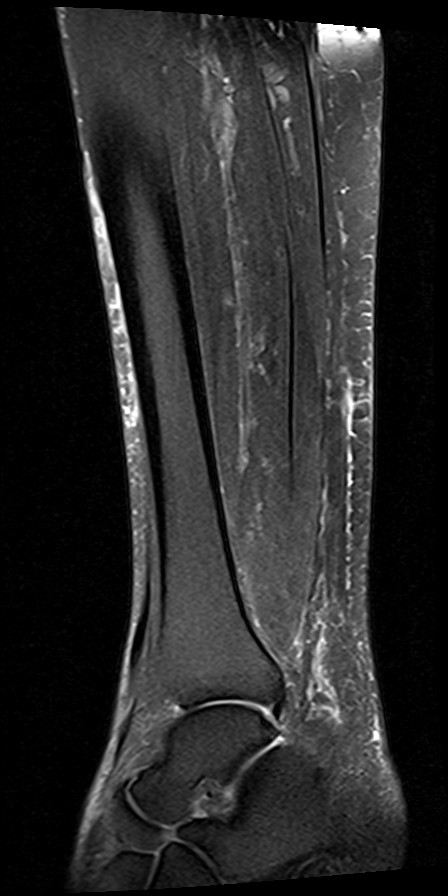
[im 30/30]
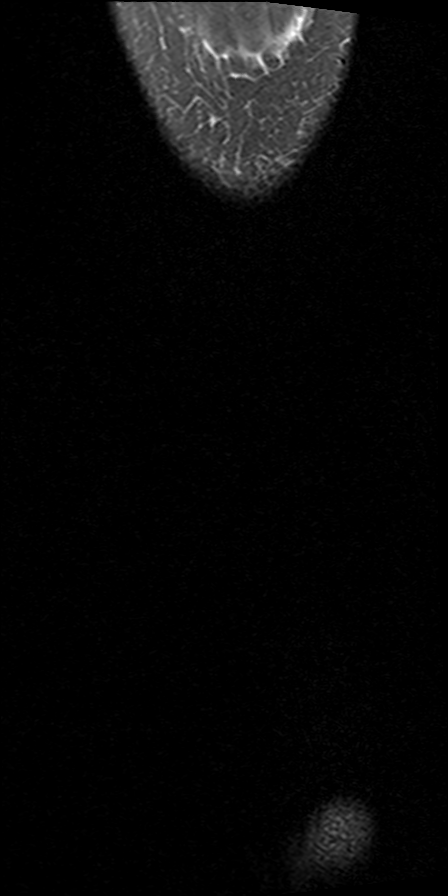

[10 of 40 positions shown; findings below may reference images not displayed]

FINDINGS: Bones/Joint/Cartilage

No marrow signal abnormality. No fracture or dislocation. Normal
alignment. No joint effusion.

Tendons
The Achilles tendon is intact. Visualized flexor, extensor and
peroneal tendons are intact.

Muscles

Normal muscle signal.

Soft tissue
No fluid collection or hematoma. 8 x 7 x 7 mm mildly T2
hyperintense, T1 intermediate signal, avidly enhancing mass in the
subcutaneous fat of the distal right lateral lower leg extending to
the dermis.
IMPRESSION: 8 x 7 x 7 mm soft tissue mass in the distal right lateral lower leg.
Differential considerations include both benign and malignant
etiologies including synovial cell sarcoma and Patilla cell
carcinoma. Tissue diagnosis is recommended.

## 2017-08-26 ENCOUNTER — Ambulatory Visit: Payer: BLUE CROSS/BLUE SHIELD | Admitting: Family Medicine

## 2017-09-17 ENCOUNTER — Ambulatory Visit: Payer: Self-pay | Admitting: Family Medicine

## 2017-09-18 ENCOUNTER — Ambulatory Visit: Payer: BLUE CROSS/BLUE SHIELD

## 2017-10-08 ENCOUNTER — Ambulatory Visit (INDEPENDENT_AMBULATORY_CARE_PROVIDER_SITE_OTHER): Payer: BLUE CROSS/BLUE SHIELD | Admitting: Family Medicine

## 2017-10-08 ENCOUNTER — Other Ambulatory Visit: Payer: Self-pay

## 2017-10-08 ENCOUNTER — Encounter: Payer: Self-pay | Admitting: Family Medicine

## 2017-10-08 VITALS — BP 112/80 | HR 70 | Temp 98.4°F | Wt 151.0 lb

## 2017-10-08 DIAGNOSIS — I10 Essential (primary) hypertension: Secondary | ICD-10-CM | POA: Diagnosis not present

## 2017-10-08 DIAGNOSIS — G8929 Other chronic pain: Secondary | ICD-10-CM

## 2017-10-08 DIAGNOSIS — M25562 Pain in left knee: Secondary | ICD-10-CM

## 2017-10-08 MED ORDER — NAPROXEN 500 MG PO TABS: 500.0000 mg | ORAL_TABLET | Freq: Two times a day (BID) | ORAL | 0 refills | Status: DC

## 2017-10-08 NOTE — Patient Instructions (Signed)
It was good to see you today!  Refill of naproxen sent to your pharmacy.   Blood pressure looks good today. If you need refill of your blood pressure medication, please request it through your pharmacy and it will be sent to your primary care doctor.  We are checking some labs today. If results require attention, either myself or my nurse will get in touch with you. If everything is normal, you will get a letter in the mail or a message in My Chart. Please give [place] a call if you do not hear from [place] after 2 weeks.    Please bring all of your medications with you to each visit.   Sign up for My Chart to have easy access to your labs results, and communication with your primary care physician.  Feel free to call with any questions or concerns at any time, at [phone].   Take care,  Dr. Bufford Lope, [place]

## 2017-10-08 NOTE — Progress Notes (Signed)
    Subjective:  Katie Humphrey is a 62 y.o. female who presents to the [facility] today with a chief complaint of L knee pain.   HPI:  Has longstanding OA of L knee, ran out of naproxen. L knee is hurting more now since she is not on her NSAIDs. Can walk on it but hurting more so having to rest. Rubbing with popping and cracking. Used to wear a sleeve. No acute trauma or unusual physical activity Still not interested in steroid injection because when she has her naproxen she is well able to function No redness or swelling.  No warmth  ROS: Per HPI   Objective:  Physical Exam: BP 112/80   Pulse 70   Temp 98.4 F (36.9 C) (Oral)   Wt 151 lb (68.5 kg)   SpO2 97%   BMI 28.53 kg/m   Gen: NAD, resting comfortably Pulm: NWOB on room air MSK: L knee without warmth or edema, normal ROM with with slight crepitus. No ligamentous laxity Skin: warm, dry Neuro: grossly normal, moves all extremities Psych: Normal affect and thought content   Assessment/Plan:  Chronic pain of left knee Patient with acute worsening of her chronic osteoarthritis related knee pain after running out of naproxen at home.  Refill of naproxen given today.  Last time patient BMP was checked with 2 years ago and given the chronic NSAID use, we will recheck today.   Bufford Lope, DO PGY-3, [facility] 10/08/2017 2:43 PM

## 2017-10-08 NOTE — Assessment & Plan Note (Signed)
Patient with acute worsening of her chronic osteoarthritis related knee pain after running out of naproxen at home.  Refill of naproxen given today.  Last time patient BMP was checked with 2 years ago and given the chronic NSAID use, we will recheck today.

## 2017-10-09 ENCOUNTER — Encounter: Payer: Self-pay | Admitting: Family Medicine

## 2017-10-09 LAB — BASIC METABOLIC PANEL
BUN/Creatinine Ratio: 12 (ref 12–28)
BUN: 11 mg/dL (ref 8–27)
CO2: 26 mmol/L (ref 20–29)
Calcium: 10.3 mg/dL (ref 8.7–10.3)
Chloride: 102 mmol/L (ref 96–106)
Creatinine, Ser: 0.89 mg/dL (ref 0.57–1.00)
GFR calc Af Amer: 81 mL/min/{1.73_m2} (ref >59)
GFR calc non Af Amer: 70 mL/min/{1.73_m2} (ref >59)
Glucose: 102 mg/dL — ABNORMAL HIGH (ref 65–99)
Potassium: 4.1 mmol/L (ref 3.5–5.2)
Sodium: 141 mmol/L (ref 134–144)

## 2017-10-20 ENCOUNTER — Other Ambulatory Visit: Payer: Self-pay | Admitting: Family Medicine

## 2017-10-20 DIAGNOSIS — M25562 Pain in left knee: Principal | ICD-10-CM

## 2017-10-20 DIAGNOSIS — G8929 Other chronic pain: Secondary | ICD-10-CM

## 2017-11-04 ENCOUNTER — Ambulatory Visit: Payer: Self-pay | Admitting: Family Medicine

## 2017-11-07 ENCOUNTER — Other Ambulatory Visit: Payer: Self-pay | Admitting: Family Medicine

## 2017-11-07 DIAGNOSIS — M25562 Pain in left knee: Principal | ICD-10-CM

## 2017-11-07 DIAGNOSIS — G8929 Other chronic pain: Secondary | ICD-10-CM

## 2017-11-29 ENCOUNTER — Other Ambulatory Visit: Payer: Self-pay | Admitting: Family Medicine

## 2017-11-29 DIAGNOSIS — G8929 Other chronic pain: Secondary | ICD-10-CM

## 2017-11-29 DIAGNOSIS — M25562 Pain in left knee: Principal | ICD-10-CM

## 2017-12-12 ENCOUNTER — Other Ambulatory Visit: Payer: Self-pay | Admitting: Family Medicine

## 2017-12-12 DIAGNOSIS — M25562 Pain in left knee: Principal | ICD-10-CM

## 2017-12-12 DIAGNOSIS — G8929 Other chronic pain: Secondary | ICD-10-CM

## 2017-12-25 ENCOUNTER — Telehealth: Payer: Self-pay | Admitting: Family Medicine

## 2017-12-25 ENCOUNTER — Other Ambulatory Visit: Payer: Self-pay | Admitting: Family Medicine

## 2017-12-25 DIAGNOSIS — R0789 Other chest pain: Secondary | ICD-10-CM

## 2017-12-25 NOTE — Telephone Encounter (Signed)
Pt is calling to ask if Dr. Reesa Chew could send in another referral for a cardiologist. [facility] but [facility] has [facility]. She is starting to have issues with her heart again. She said she is not sure if it is indigestion or something else but she wants to make sure. Please call her at [phone]

## 2017-12-25 NOTE — Telephone Encounter (Signed)
I am on nights currently so may be difficult to call her but I have placed the referral for her.  Please inform patient of this, thanks!

## 2017-12-26 ENCOUNTER — Ambulatory Visit (INDEPENDENT_AMBULATORY_CARE_PROVIDER_SITE_OTHER): Payer: BLUE CROSS/BLUE SHIELD | Admitting: Family Medicine

## 2017-12-26 ENCOUNTER — Other Ambulatory Visit: Payer: Self-pay

## 2017-12-26 VITALS — BP 128/68 | HR 60 | Temp 98.6°F | Ht 61.0 in | Wt 154.0 lb

## 2017-12-26 DIAGNOSIS — I10 Essential (primary) hypertension: Secondary | ICD-10-CM | POA: Diagnosis not present

## 2017-12-26 DIAGNOSIS — Z23 Encounter for immunization: Secondary | ICD-10-CM

## 2017-12-26 DIAGNOSIS — R079 Chest pain, unspecified: Secondary | ICD-10-CM

## 2017-12-26 NOTE — Progress Notes (Signed)
Subjective:    Katie Humphrey is a 62 y.o. female who presents for evaluation of chest pain. She does not have it right now. Onset was 10 days ago. Symptoms have been unchanged since that time. The patient describes the pain as sharp, left substernal and does not radiate. Patient rates pain as a 5/10 in intensity. Associated symptoms are: tachypnea. Aggravating factors are: moving left arm when standing or seated, . Alleviating factors are: nitroglycerin 1 tablets and rest. Patient's cardiac risk factors are: hypertension. Patient's risk factors for DVT/PE: none. Previous cardiac testing: exercise stress test no st change post exercise, 2015.  Patient thinks it indigestion. Pt has h/o heartburn, but does not feel like it. No known exersise/trauma injury.   The following portions of the patient's history were reviewed and updated as appropriate: allergies, current medications, past family history, past medical history, past social history, past surgical history and problem list.  Review of Systems Pertinent items noted in HPI and remainder of comprehensive ROS otherwise negative.    Objective:   Vitals:   12/26/17 0839  BP: 128/68  Pulse: 60  Temp: 98.6 F (37 C)  TempSrc: Oral  SpO2: 99%  Weight: 154 lb (69.9 kg)  Height: 5\' 1"  (1.549 m)     BP 128/68   Pulse 60   Temp 98.6 F (37 C) (Oral)   Ht 5\' 1"  (1.549 m)   Wt 154 lb (69.9 kg)   SpO2 99%   BMI 29.10 kg/m  Physical Exam  Vitals reviewed. Constitutional: She is oriented to person, place, and time. She appears well-developed and well-nourished. No distress.  HENT:  Head: Normocephalic and atraumatic.  Eyes: Right eye exhibits no discharge. Left eye exhibits no discharge. No scleral icterus.  Neck: Neck supple. No JVD present.  Cardiovascular: Normal rate and regular rhythm. Exam reveals no gallop and no friction rub.  No murmur heard. Respiratory: Effort normal and breath sounds normal. No respiratory distress. She has  no wheezes. She has no rales.  GI: Soft. She exhibits no distension.  Musculoskeletal: She exhibits no edema.  Neurological: She is alert and oriented to person, place, and time.  Skin: Skin is warm and dry.  Psychiatric: She has a normal mood and affect. Her behavior is normal.    Assessment:    Chest pain, suspected etiology: Atypical chest pain, likely MSK given pt is associated with extending arm over her head. Concerning elements for ischemic disease include not reproducable today in office. Risk factors include race, age, HTN    Plan:    Patient history and exam consistent with non-cardiac cause of chest pain. OTC analgesics as needed. Risk stratitification with exercise tolerance test, lipid profile

## 2017-12-26 NOTE — Telephone Encounter (Signed)
Attempted to call and inform patient of referral. No answer and no voice mail.  Ozella Almond, [place]

## 2017-12-26 NOTE — Telephone Encounter (Signed)
Informed patient of referral placement.  Katie Humphrey, [place]

## 2017-12-26 NOTE — Patient Instructions (Signed)

## 2017-12-27 LAB — LIPID PANEL
Chol/HDL Ratio: 2.2 ratio (ref 0.0–4.4)
Cholesterol, Total: 177 mg/dL (ref 100–199)
HDL: 79 mg/dL (ref >39)
LDL Calculated: 80 mg/dL (ref 0–99)
Triglycerides: 88 mg/dL (ref 0–149)
VLDL Cholesterol Cal: 18 mg/dL (ref 5–40)

## 2018-01-01 ENCOUNTER — Encounter: Payer: Self-pay | Admitting: Family Medicine

## 2018-01-21 ENCOUNTER — Other Ambulatory Visit: Payer: Self-pay | Admitting: Internal Medicine

## 2018-01-21 DIAGNOSIS — I10 Essential (primary) hypertension: Secondary | ICD-10-CM

## 2018-01-28 ENCOUNTER — Ambulatory Visit: Payer: Self-pay | Admitting: Family Medicine

## 2018-01-28 NOTE — Progress Notes (Deleted)
   Subjective:   Patient ID: Katie Humphrey    DOB: 12/11/1956, 62 y.o. female   MRN: [id number]  CC: ***  HPI: Katie Humphrey is a 62 y.o. female who presents to clinic today ***. Problems discussed today are as follows:  ROS: See HPI for pertinent ROS.  [place]: Pertinent past medical, surgical, family, and social history were reviewed and updated as appropriate. Smoking status reviewed.  Medications reviewed. Objective:   There were no vitals taken for this visit. Vitals and nursing note reviewed.  General: well nourished, well developed, in no acute distress with non-toxic appearance HEENT: NCAT, EOMI, PERRL, MMM, oropharynx clear Neck: supple, non-tender, normal ROM, no LAD  CV: RRR no MRG  Lungs: CTAB, normal effort  Abdomen: soft, NTND, no masses or organomegaly, +bs  Skin: warm, dry, no rashes or lesions, cap refill < 2 seconds Extremities: warm and well perfused, normal tone Neuro: alert, oriented x3, no focal deficits   Assessment & Plan:   No problem-specific Assessment & Plan notes found for this encounter.  No orders of the defined types were placed in this encounter.  No orders of the defined types were placed in this encounter.   Lovenia Kim, MD [place], PGY-3 01/28/2018 1:24 PM

## 2019-01-27 ENCOUNTER — Other Ambulatory Visit: Payer: Self-pay

## 2019-01-27 DIAGNOSIS — I10 Essential (primary) hypertension: Secondary | ICD-10-CM

## 2019-01-27 MED ORDER — BISOPROLOL-HYDROCHLOROTHIAZIDE 5-6.25 MG PO TABS: 1.0000 | ORAL_TABLET | Freq: Every day | ORAL | 0 refills | Status: DC

## 2019-02-10 ENCOUNTER — Other Ambulatory Visit: Payer: Self-pay | Admitting: *Deleted

## 2019-02-10 DIAGNOSIS — I10 Essential (primary) hypertension: Secondary | ICD-10-CM

## 2019-02-11 MED ORDER — CLONIDINE HCL 0.1 MG PO TABS: 0.1000 mg | ORAL_TABLET | Freq: Every day | ORAL | 0 refills | Status: DC

## 2019-03-17 ENCOUNTER — Ambulatory Visit: Payer: BLUE CROSS/BLUE SHIELD | Admitting: Family Medicine

## 2019-03-27 ENCOUNTER — Telehealth: Payer: Self-pay | Admitting: *Deleted

## 2019-03-27 NOTE — Telephone Encounter (Signed)
LVM to call office to go over screening questions prior to visit on Monday. Beadie Matsunaga Zimmerman Rumple, CMA   

## 2019-03-30 ENCOUNTER — Other Ambulatory Visit: Payer: Self-pay

## 2019-03-30 ENCOUNTER — Ambulatory Visit (INDEPENDENT_AMBULATORY_CARE_PROVIDER_SITE_OTHER): Payer: Self-pay | Admitting: Family Medicine

## 2019-03-30 ENCOUNTER — Encounter: Payer: Self-pay | Admitting: Family Medicine

## 2019-03-30 VITALS — BP 136/74 | HR 78 | Ht 61.0 in | Wt 163.4 lb

## 2019-03-30 DIAGNOSIS — Z1231 Encounter for screening mammogram for malignant neoplasm of breast: Secondary | ICD-10-CM

## 2019-03-30 DIAGNOSIS — J302 Other seasonal allergic rhinitis: Secondary | ICD-10-CM

## 2019-03-30 DIAGNOSIS — I10 Essential (primary) hypertension: Secondary | ICD-10-CM

## 2019-03-30 DIAGNOSIS — Z0000 Encounter for general adult medical examination without abnormal findings: Secondary | ICD-10-CM

## 2019-03-30 NOTE — Progress Notes (Signed)
   Subjective:    Patient ID: Katie Humphrey, female    DOB: 12/11/1956, 64 y.o.   MRN: [id number]   CC: HTN f/u   HPI: Katie Humphrey is a 64 year old female with hypertension, allergies, osteoarthritis presenting discussed the following:  Hypertension: Patient here for follow-up of controlled arterial hypertension. Blood pressure is well controlled at home.  Has a BP cuff at home, normally 123456 systolic.  He/She is exercising and is adherent to a low-salt diet. She currently takes bisoprolol-HCTZ and clonidine 0.1 tablet at bedtime and is adherent to regimen.  States she was previously put on clonidine several years ago to also help her with her sleep.  Tolerates this regimen well and wishes to continue with this.  Cardiac symptoms: none. Patient denies: chest pain, dyspnea, exertional chest pressure/discomfort, fatigue, irregular heart beat, lower extremity edema and near-syncope. Cardiovascular risk factors: obesity (BMI >= 30 kg/m2). Use of agents associated with hypertension: NSAIDS. History of target organ damage: none.  Health maintenance: Due for Tdap, Pap smear, mammogram, and colonoscopy.  Willing to get mammogram, would like to talk about colonoscopy and obtain Pap smear on follow-up.  Additionally, says her seasonal allergies active whenever she is around cigarette smoke.  [facility] non-smoking building, needs letter stating this.  Objective:  BP 136/74   Pulse 78   Ht 5\' 1"  (1.549 m)   Wt 163 lb 6.4 oz (74.1 kg)   SpO2 98%   BMI 30.87 kg/m  Vitals and nursing note reviewed  General: NAD, pleasant Cardiac: RRR, normal heart sounds with loud S2.  Respiratory: CTAB, normal effort Abdomen: soft, nontender, nondistended Extremities: no edema or cyanosis. WWP. Skin: warm and dry Neuro: alert and oriented, no focal deficits Psych: normal affect  Assessment & Plan:   Hypertension Well-controlled on current regimen. Continue Bisoprolol-HCTZ and  clonidine nightly.  ASCVD 10-year risk 6.4%, does not need statin at this time.  Will check BMP for renal/electrolytes, Cr wnl in 2019, especially given chronic use of NSAIDs for osteoarthritis.  Allergic rhinitis Provided with letter to her living facility to request non-smoking building.  Continue Claritin daily.  Preventative health care Placed order for mammogram and given breast center information to schedule an appointment.  Follow-up within the next several weeks at her convenience for Pap smear.  Would like to discuss colonoscopy, Shingrix, and Tdap at that time.   Follow-up in the next several weeks for Pap smear or sooner if needed.  [facility] Resident PGY-2

## 2019-03-30 NOTE — Assessment & Plan Note (Addendum)
Well-controlled on current regimen. Continue Bisoprolol-HCTZ and clonidine nightly.  ASCVD 10-year risk 6.4%, does not need statin at this time.  Will check BMP for renal/electrolytes, Cr wnl in 2019, especially given chronic use of NSAIDs for osteoarthritis.

## 2019-03-30 NOTE — Patient Instructions (Signed)
It was a wonderful meeting you today.  We will keep your blood pressure medications as is and will check your kidney function today.  I will let you know these results in the next several days.  I have also sent an order for you to get a mammogram, you can call the breast center to have this scheduled.  At your convenience, please make sure you follow-up to get your Pap smear and we can discuss colonoscopy further.

## 2019-03-30 NOTE — Assessment & Plan Note (Addendum)
Placed order for mammogram and given breast center information to schedule an appointment.  Follow-up within the next several weeks at her convenience for Pap smear.  Would like to discuss colonoscopy, Shingrix, and Tdap at that time.

## 2019-03-30 NOTE — Assessment & Plan Note (Signed)
Provided with letter to her living facility to request non-smoking building.  Continue Claritin daily.

## 2019-03-31 LAB — BASIC METABOLIC PANEL
BUN/Creatinine Ratio: 22 (ref 12–28)
BUN: 22 mg/dL (ref 8–27)
CO2: 24 mmol/L (ref 20–29)
Calcium: 10.1 mg/dL (ref 8.7–10.3)
Chloride: 103 mmol/L (ref 96–106)
Creatinine, Ser: 1.02 mg/dL — ABNORMAL HIGH (ref 0.57–1.00)
GFR calc Af Amer: 68 mL/min/{1.73_m2} (ref >59)
GFR calc non Af Amer: 59 mL/min/{1.73_m2} — ABNORMAL LOW (ref >59)
Glucose: 105 mg/dL — ABNORMAL HIGH (ref 65–99)
Potassium: 4.3 mmol/L (ref 3.5–5.2)
Sodium: 141 mmol/L (ref 134–144)

## 2019-04-01 ENCOUNTER — Telehealth: Payer: Self-pay

## 2019-04-01 NOTE — Telephone Encounter (Signed)
Pt returning call regarding lab results. Informed of Dr. Stephenie Acres lab note. Patient verbalized understanding.  Talbot Grumbling, RN

## 2019-05-03 ENCOUNTER — Other Ambulatory Visit: Payer: Self-pay | Admitting: Family Medicine

## 2019-05-03 DIAGNOSIS — I10 Essential (primary) hypertension: Secondary | ICD-10-CM

## 2019-05-11 ENCOUNTER — Ambulatory Visit: Payer: Self-pay

## 2019-05-12 ENCOUNTER — Other Ambulatory Visit: Payer: Self-pay | Admitting: Family Medicine

## 2019-05-12 DIAGNOSIS — I10 Essential (primary) hypertension: Secondary | ICD-10-CM

## 2019-05-13 ENCOUNTER — Ambulatory Visit: Payer: Self-pay | Admitting: Family Medicine

## 2019-05-13 NOTE — Telephone Encounter (Signed)
Patient LVM on nurse line following up on rx refill of clonidine.   To PCP  Talbot Grumbling, RN

## 2019-07-06 ENCOUNTER — Ambulatory Visit
Admission: RE | Admit: 2019-07-06 | Discharge: 2019-07-06 | Disposition: A | Discharge status: Home / Self Care | Payer: Self-pay | Source: Ambulatory Visit | Attending: Family Medicine | Admitting: Family Medicine

## 2019-07-06 ENCOUNTER — Other Ambulatory Visit: Payer: Self-pay

## 2019-07-06 DIAGNOSIS — Z1231 Encounter for screening mammogram for malignant neoplasm of breast: Secondary | ICD-10-CM

## 2019-08-05 ENCOUNTER — Other Ambulatory Visit: Payer: Self-pay | Admitting: *Deleted

## 2019-08-05 DIAGNOSIS — I10 Essential (primary) hypertension: Secondary | ICD-10-CM

## 2019-08-05 MED ORDER — CLONIDINE HCL 0.1 MG PO TABS: 0.1000 mg | ORAL_TABLET | Freq: Every day | ORAL | 2 refills | Status: DC

## 2019-08-05 MED ORDER — BISOPROLOL-HYDROCHLOROTHIAZIDE 5-6.25 MG PO TABS: 1.0000 | ORAL_TABLET | Freq: Every day | ORAL | 2 refills | Status: DC

## 2019-08-20 ENCOUNTER — Other Ambulatory Visit: Payer: Self-pay | Admitting: Family Medicine

## 2019-08-20 DIAGNOSIS — I10 Essential (primary) hypertension: Secondary | ICD-10-CM

## 2019-12-17 ENCOUNTER — Ambulatory Visit: Payer: Self-pay | Admitting: Family Medicine

## 2019-12-28 ENCOUNTER — Ambulatory Visit: Payer: Self-pay

## 2020-02-25 ENCOUNTER — Other Ambulatory Visit: Payer: Self-pay | Admitting: Family Medicine

## 2020-02-25 DIAGNOSIS — I10 Essential (primary) hypertension: Secondary | ICD-10-CM

## 2020-04-17 NOTE — Progress Notes (Signed)
    SUBJECTIVE:   CHIEF COMPLAINT / HPI:   Chronic pain of left knee Patient presented to office today reporting left knee pain and swelling. Patient has a history of left knee osteoarthritis for which she has taken naproxen.  Patient was last seen for this concern in 2019.   Today the patient says she fell twice twice this Saturday. She says her knee gives out on her. She has been taking Tylenol 500mg  twice daily for pain, but she has stopped taking NSAIDs. She places a pillow under her knee/leg to relieve pain at night. Her pain is on either side of her L knee, the pain is worse at the end of the day and it feels heavy. She also reports a little bit of swelling    HTN: BP today is 176/98. The patient has been taking Bisoprolol-HCTZ 5-6.25 once daily with Clonidine 0.1mg  at bedtime. HR today is 73. Patient says that she occasionally has headaches with elevated BP, but today denies headaches, vision changes, chest pain, shortness of breath.  PERTINENT  PMH / PSH:  Patient Active Problem List   Diagnosis Date Noted  . Chronic pain of left knee 11/06/2016  . Allergic rhinitis 07/10/2013  . Insomnia 03/17/2013  . Anxiety 11/30/2011  . OA (osteoarthritis) of knee 11/30/2011  . Preventative health care 11/30/2011  . Hypertension    OBJECTIVE:   BP (!) 176/98   Pulse 73   Ht 5' (1.524 m)   Wt 165 lb 9.6 oz (75.1 kg)   SpO2 98%   BMI 32.34 kg/m    Physical exam: General: Well-appearing patient, very pleasant Respiratory: Comfortable work of breathing, speaking complete sentences Left Knee: - Inspection: no gross deformity. No swelling/effusion, erythema or bruising. Skin intact - Palpation: no TTP - ROM: full active ROM with flexion and extension in knee and hip - Strength: 5/5 strength - Neuro/vasc: NV intact - Special Tests: - LIGAMENTS: negative anterior and posterior drawer, negative Lachman's, no MCL or LCL laxity  -- MENISCUS: negative McMurray's, negative Thessaly  -- PF  JOINT: nml patellar mobility bilaterally.  Positive patellar grind  Hips: normal ROM, negative FABER and FADIR bilaterally  ASSESSMENT/PLAN:   Hypertension BP elevated today, patient states she ate salty foods last night.  1. Take 2 tablets of Toprol-HCTZ daily.  Continue clonidine.   Chronic pain of left knee Most likely secondary to osteoarthritis. 1.  Referral made to physical therapy for patient's knee, especially with concerns for knee "giving out". 2.  Patient given information for compression stockings. "[facility]" [address], [place], [place] [address].  Was instructed to call to make an appt [phone] 3.  Asked to follow-up for her knee in 4-6 weeks   [place]

## 2020-04-17 NOTE — Patient Instructions (Addendum)
Thank you for coming in to see [place] today! Please see below to review our plan for today's visit:  1. Take 2 of your Blood pressure pills (Bisoprolol-HCTZ) daily. Continue the clonidine. 2. I have referred you to Physical Therapy for your knee. I believe your symptoms are caused by Osteoarthritis. Follow up with [place] in 4-6 weeks.  3. For your leg swelling please wear compression stockings. "[facility]" [address], [place], [place] [address]. Call to make an appt [phone]  Please call the clinic at [phone] if your symptoms worsen or you have any concerns. It was our pleasure to serve you!   Dr. Milus Banister [facility]

## 2020-04-18 ENCOUNTER — Other Ambulatory Visit: Payer: Self-pay

## 2020-04-18 ENCOUNTER — Encounter: Payer: Self-pay | Admitting: Family Medicine

## 2020-04-18 ENCOUNTER — Ambulatory Visit (INDEPENDENT_AMBULATORY_CARE_PROVIDER_SITE_OTHER): Payer: Self-pay | Admitting: Family Medicine

## 2020-04-18 VITALS — BP 176/98 | HR 73 | Ht 60.0 in | Wt 165.6 lb

## 2020-04-18 DIAGNOSIS — M25562 Pain in left knee: Secondary | ICD-10-CM

## 2020-04-18 DIAGNOSIS — I10 Essential (primary) hypertension: Secondary | ICD-10-CM

## 2020-04-18 DIAGNOSIS — G8929 Other chronic pain: Secondary | ICD-10-CM

## 2020-04-18 NOTE — Assessment & Plan Note (Signed)
BP elevated today, patient states she ate salty foods last night.  1. Take 2 tablets of Toprol-HCTZ daily.  Continue clonidine.

## 2020-04-18 NOTE — Assessment & Plan Note (Signed)
Most likely secondary to osteoarthritis. 1.  Referral made to physical therapy for patient's knee, especially with concerns for knee "giving out". 2.  Patient given information for compression stockings. "[facility]" [address], [place], [place] [address].  Was instructed to call to make an appt [phone] 3.  Asked to follow-up for her knee in 4-6 weeks

## 2020-05-09 ENCOUNTER — Ambulatory Visit: Payer: Self-pay

## 2020-05-09 ENCOUNTER — Other Ambulatory Visit: Payer: Self-pay

## 2020-05-09 DIAGNOSIS — I10 Essential (primary) hypertension: Secondary | ICD-10-CM

## 2020-05-09 MED ORDER — BISOPROLOL-HYDROCHLOROTHIAZIDE 5-6.25 MG PO TABS: 1.0000 | ORAL_TABLET | Freq: Every day | ORAL | 0 refills | Status: DC

## 2020-05-09 MED ORDER — CLONIDINE HCL 0.1 MG PO TABS: 0.1000 mg | ORAL_TABLET | Freq: Every day | ORAL | 0 refills | Status: DC

## 2020-06-12 ENCOUNTER — Other Ambulatory Visit: Payer: Self-pay | Admitting: Family Medicine

## 2020-06-12 DIAGNOSIS — I10 Essential (primary) hypertension: Secondary | ICD-10-CM

## 2020-06-22 ENCOUNTER — Other Ambulatory Visit: Payer: Self-pay | Admitting: Family Medicine

## 2020-06-22 DIAGNOSIS — Z1231 Encounter for screening mammogram for malignant neoplasm of breast: Secondary | ICD-10-CM

## 2020-08-11 ENCOUNTER — Encounter: Payer: Self-pay | Admitting: Family Medicine

## 2020-08-11 NOTE — Progress Notes (Deleted)
    SUBJECTIVE:   Chief compliant/HPI: annual examination  RAELI WIENS is a 65 y.o. who presents today for an annual exam.   Papsmear    History tabs reviewed and updated ***.   Review of systems form reviewed and notable for ***.   OBJECTIVE:   There were no vitals taken for this visit.  ***  ASSESSMENT/PLAN:   No problem-specific Assessment & Plan notes found for this encounter.    Annual Examination  See AVS for age appropriate recommendations  PHQ score ***, reviewed and discussed.  BP reviewed and at goal ***.  Asked about intimate partner violence and resources given as appropriate  Advance directives discussion ***  Considered the following items based upon USPSTF recommendations: Diabetes screening: {discussed/ordered:14545} Screening for elevated cholesterol: {discussed/ordered:14545} HIV testing: {discussed/ordered:14545} Hepatitis C: {discussed/ordered:14545} Hepatitis B: {discussed/ordered:14545} Syphilis if at high risk: {discussed/ordered:14545} GC/CT {GC/CT screening :23818} Osteoporosis screening considered based upon risk of fracture from [facility] calculator. Major osteoporotic fracture risk is ***%. DEXA {ordered not order:23822}.  Reviewed risk factors for latent tuberculosis and {not indicated/requested/declined:14582}   Discussed family history, BRCA testing {not indicated/requested/declined:14582}. Tool used to risk stratify was ***.  Cervical cancer screening: {PAPTYPE:23819} Breast cancer screening: {mammoscreen:23820} Colorectal cancer screening: {crcscreen:23821::"discussed, colonoscopy ordered"} Lung cancer screening: {discussed/declined/written info:19698}. See documentation below regarding indications/risks/benefits.  Vaccinations ***.   Follow up in 1 *** year or sooner if indicated.    Patriciaann Clan, [place]

## 2020-08-18 ENCOUNTER — Other Ambulatory Visit: Payer: Self-pay

## 2020-08-18 DIAGNOSIS — I10 Essential (primary) hypertension: Secondary | ICD-10-CM

## 2020-08-18 MED ORDER — BISOPROLOL-HYDROCHLOROTHIAZIDE 5-6.25 MG PO TABS: 1.0000 | ORAL_TABLET | Freq: Every day | ORAL | 0 refills | Status: DC

## 2020-08-18 MED ORDER — CLONIDINE HCL 0.1 MG PO TABS: ORAL_TABLET | ORAL | 0 refills | Status: DC

## 2020-08-29 ENCOUNTER — Ambulatory Visit (INDEPENDENT_AMBULATORY_CARE_PROVIDER_SITE_OTHER): Payer: Medicare Other | Admitting: Family Medicine

## 2020-08-29 ENCOUNTER — Ambulatory Visit: Payer: Self-pay

## 2020-08-29 ENCOUNTER — Ambulatory Visit
Admission: RE | Admit: 2020-08-29 | Discharge: 2020-08-29 | Disposition: A | Discharge status: Home / Self Care | Payer: Medicare Other | Source: Ambulatory Visit | Attending: Family Medicine | Admitting: Family Medicine

## 2020-08-29 ENCOUNTER — Other Ambulatory Visit: Payer: Self-pay

## 2020-08-29 VITALS — BP 122/68 | HR 68 | Ht 60.0 in | Wt 163.8 lb

## 2020-08-29 DIAGNOSIS — I10 Essential (primary) hypertension: Secondary | ICD-10-CM | POA: Diagnosis not present

## 2020-08-29 DIAGNOSIS — Z23 Encounter for immunization: Secondary | ICD-10-CM

## 2020-08-29 DIAGNOSIS — Z1231 Encounter for screening mammogram for malignant neoplasm of breast: Secondary | ICD-10-CM

## 2020-08-29 MED ORDER — CLONIDINE HCL 0.1 MG PO TABS: ORAL_TABLET | ORAL | 0 refills | Status: DC

## 2020-08-29 MED ORDER — BISOPROLOL-HYDROCHLOROTHIAZIDE 5-6.25 MG PO TABS: 1.0000 | ORAL_TABLET | Freq: Every day | ORAL | 0 refills | Status: DC

## 2020-08-29 NOTE — Progress Notes (Signed)
    SUBJECTIVE:   CHIEF COMPLAINT / HPI:   [name] is a 65 yo who presents for her annual exam. She requests a refill of her blood pressure medication. Also states she sometimes has a tingling around chest that she thinks may be indigestion for the past couple of weeks. Denies sharp pain. Denies shortness of breath. Denies current palpitations.   Hypertension: Endorses checking BP at home stating it is typically around 120/70.   Gets physical activity in by walking around daily and playing with her puppy  Diet- eats beef, chicken, salad . Well balanced  Denies tobacco, alcohol, or recreational drug use   OBJECTIVE:   BP 122/68   Pulse 68   Ht 5' (1.524 m)   Wt 163 lb 12.8 oz (74.3 kg)   SpO2 98%   BMI 31.99 kg/m    General: alert, pleasant, NAD CV: RRR no murmurs Resp: CTAB normal WOB GI: soft, non distended   ASSESSMENT/PLAN:   No problem-specific Assessment & Plan notes found for this encounter.   HTN BP 122/68. Well controlled on current medication - refilled Bisoprolol-HCTZ and Clonidine 0.1 mg daily  - BMP   Health maintenance Discussed Colonoscopy and gave patient information to schedule appt -  Prevnar [place], [place]

## 2020-08-29 NOTE — Patient Instructions (Addendum)
It was great seeing you today! You came in for your annual physical exam, and I am glad to see you are doing so well!   I have refilled your medications and we gave you the pneumonia vaccine. We are also checking your kidney function. If anything is abnormal I will give you a call!   I'd like to see you back in 1 year for your next annual exam, but if you need to be seen earlier than that for any new issues we're happy to fit you in, just give [place] a call!  Visit Reminders: - Stop by the pharmacy to pick up your prescriptions  - Continue to work on your healthy eating habits and incorporating exercise into your daily life.   Feel free to call with any questions or concerns at any time, at [phone].   Take care,  Dr. Shary Key [facility]     Pneumococcal Conjugate Vaccine (Prevnar 20) Suspension for Injection What is this medication? PNEUMOCOCCAL VACCINE (NEU mo KOK al vak SEEN) is a vaccine. It prevents pneumococcus bacterial infections. These bacteria can cause serious infections like pneumonia, meningitis, and blood infections. This vaccine will not treat an infection and will not cause infection. This vaccine is recommended foradults 18 years and older. This medicine may be used for other purposes; ask your health care provider orpharmacist if you have questions. COMMON BRAND NAME(S): Prevnar 20 What should I tell my care team before I take this medication? They need to know if you have any of these conditions: bleeding disorder fever immune system problems an unusual or allergic reaction to pneumococcal vaccine, diphtheria toxoid, other vaccines, other medicines, foods, dyes, or preservatives pregnant or trying to get pregnant breast-feeding How should I use this medication? This vaccine is injected into a muscle. It is given by a health care provider. A copy of Vaccine Information Statements will be given before each vaccination. Be sure to read  this information carefully each time. This sheet may changeoften. Talk to your health care provider about the use of this medicine in children.Special care may be needed. Overdosage: If you think you have taken too much of this medicine contact apoison control center or emergency room at once. NOTE: This medicine is only for you. Do not share this medicine with others. What if I miss a dose? This does not apply. This medicine is not for regular use. What may interact with this medication? medicines for cancer chemotherapy medicines that suppress your immune function steroid medicines like prednisone or cortisone This list may not describe all possible interactions. Give your health care provider a list of all the medicines, herbs, non-prescription drugs, or dietary supplements you use. Also tell them if you smoke, drink alcohol, or use illegaldrugs. Some items may interact with your medicine. What should I watch for while using this medication? Mild fever and pain should go away in 3 days or less. Report any unusualsymptoms to your health care provider. What side effects may I notice from receiving this medication? Side effects that you should report to your doctor or health care professionalas soon as possible: allergic reactions (skin rash, itching or hives; swelling of the face, lips, or tongue) confusion fast, irregular heartbeat fever over 102 degrees F muscle weakness seizures trouble breathing unusual bruising or bleeding Side effects that usually do not require medical attention (report to yourdoctor or health care professional if they continue or are bothersome): fever of 102 degrees F or less headache  joint pain muscle cramps, pain pain, tender at site where injected This list may not describe all possible side effects. Call your doctor for medical advice about side effects. You may report side effects to FDA at1-800-FDA-1088. Where should I keep my medication? This vaccine  is only given by a health care provider. It will not be stored athome. NOTE: This sheet is a summary. It may not cover all possible information. If you have questions about this medicine, talk to your doctor, pharmacist, orhealth care provider.  2022 Elsevier/Gold Standard (2019-10-15 16:14:35)

## 2020-08-30 LAB — BASIC METABOLIC PANEL
BUN/Creatinine Ratio: 16 (ref 12–28)
BUN: 13 mg/dL (ref 8–27)
CO2: 27 mmol/L (ref 20–29)
Calcium: 9.8 mg/dL (ref 8.7–10.3)
Chloride: 99 mmol/L (ref 96–106)
Creatinine, Ser: 0.79 mg/dL (ref 0.57–1.00)
Glucose: 107 mg/dL — ABNORMAL HIGH (ref 65–99)
Potassium: 3.8 mmol/L (ref 3.5–5.2)
Sodium: 137 mmol/L (ref 134–144)
eGFR: 83 mL/min/{1.73_m2} (ref >59)

## 2020-10-06 ENCOUNTER — Telehealth: Payer: Self-pay

## 2020-10-06 NOTE — Telephone Encounter (Signed)
LVM to have pt call back to schedule AWV.   RE: confirm insurance and schedule AWV on my schedule if times are convenient for patient or other AWV schedule as template permits.   

## 2020-12-05 ENCOUNTER — Encounter: Payer: Self-pay | Admitting: Family Medicine

## 2020-12-05 ENCOUNTER — Ambulatory Visit (INDEPENDENT_AMBULATORY_CARE_PROVIDER_SITE_OTHER): Payer: Medicare Other | Admitting: Family Medicine

## 2020-12-05 ENCOUNTER — Other Ambulatory Visit: Payer: Self-pay

## 2020-12-05 VITALS — BP 161/87 | HR 76 | Ht 60.0 in | Wt 165.6 lb

## 2020-12-05 DIAGNOSIS — M151 Heberden's nodes (with arthropathy): Secondary | ICD-10-CM

## 2020-12-05 DIAGNOSIS — I10 Essential (primary) hypertension: Secondary | ICD-10-CM

## 2020-12-05 MED ORDER — CLONIDINE HCL 0.1 MG PO TABS: ORAL_TABLET | ORAL | 0 refills | Status: DC

## 2020-12-05 MED ORDER — BISOPROLOL-HYDROCHLOROTHIAZIDE 5-6.25 MG PO TABS: 1.0000 | ORAL_TABLET | Freq: Every day | ORAL | 0 refills | Status: DC

## 2020-12-05 NOTE — Progress Notes (Signed)
    SUBJECTIVE:   CHIEF COMPLAINT / HPI:   Katie Humphrey is a 65 yo F who presents for the below.   Finger "spurs" Ongoing for 3 months.  Located on the end of her fingers.  States these bumps are sore and sometimes will tingle.  She feels as though sometimes when she is carrying heavy things they burn.  She has been trying arthritis ointment with very little relief.  They do not bother her physically as much as they bother her cosmetically.  She is concerned about what this condition could be.  Hypertension - Medications: Ziac 5-6.25 mg daily, clonidine 0.1 mg nightly - Compliance: Yes, but did not take this morning states that she had her grandchildren spend the night and forgot it this morning.  She would also like a refill on her medications but she is not out. - Checking BP at home: Yes usually runs 120/70 - Denies any SOB, CP, vision changes  PERTINENT  PMH / PSH: Hx of OA  OBJECTIVE:   BP (!) 161/87   Pulse 76   Ht 5' (1.524 m)   Wt 75.1 kg   SpO2 97%   BMI 32.34 kg/m   Physical Exam Vitals reviewed.  Constitutional:      General: She is not in acute distress.    Appearance: She is not ill-appearing.  Cardiovascular:     Rate and Rhythm: Normal rate and regular rhythm.     Heart sounds: Normal heart sounds.  Pulmonary:     Effort: Pulmonary effort is normal.     Breath sounds: Normal breath sounds.  Musculoskeletal:     Comments: Multiple small pea-sized nodules on both hand bilaterally located on the DIPs. No skin changes or surrounding erythema.   Neurological:     Mental Status: She is alert.   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 Discussed supportive care tx such as ibuprofen and ice for comfort.    Primary hypertension Elevated today. Missed dose this morning. Report BPs normal at home. Return precautions given. Medication refilled.  -  bisoprolol-hydrochlorothiazide (ZIAC) 5-6.25 MG tablet; Take 1 tablet by mouth daily.  Dispense: 90 tablet; Refill: 0 - cloNIDine (CATAPRES) 0.1 MG tablet; TAKE 1 TABLET BY MOUTH AT  BEDTIME  Dispense: 90 tablet; Refill: 0    Katie Humphrey, [place]

## 2020-12-05 NOTE — Patient Instructions (Addendum)
We discussed the bumps on your fingers. These are called Heberden nodes which is a natural disease progression of osteoarthritis. You can use NSAIDs such as ibuprofen to help with the discomfort. You can also try ice as well.    I have sent in your refill for you blood pressure medication. If your blood pressure remains elevated at home please let us know.   Dr. Janus Molder

## 2021-01-16 ENCOUNTER — Telehealth: Payer: Self-pay | Admitting: *Deleted

## 2021-01-16 NOTE — Telephone Encounter (Signed)
Patient is requesting a handicap placard for her knee pain.  She states that the arthritis is flaring and has trouble walking on it at times.  Will forward to MD to advise if we can write her for one.  If so, the team can provide you with a blank form to complete.  Thanks [employer]

## 2021-01-17 NOTE — Telephone Encounter (Signed)
Patient scheduled for 01-23-21.  Cheral Cappucci,CMA

## 2021-01-19 ENCOUNTER — Ambulatory Visit: Payer: Medicare Other

## 2021-01-23 ENCOUNTER — Other Ambulatory Visit: Payer: Self-pay

## 2021-01-23 ENCOUNTER — Ambulatory Visit (INDEPENDENT_AMBULATORY_CARE_PROVIDER_SITE_OTHER): Payer: Medicare Other | Admitting: Family Medicine

## 2021-01-23 ENCOUNTER — Encounter: Payer: Self-pay | Admitting: Family Medicine

## 2021-01-23 VITALS — BP 173/93 | HR 68 | Temp 98.6°F | Wt 161.0 lb

## 2021-01-23 DIAGNOSIS — G8929 Other chronic pain: Secondary | ICD-10-CM | POA: Diagnosis not present

## 2021-01-23 DIAGNOSIS — Z0289 Encounter for other administrative examinations: Secondary | ICD-10-CM

## 2021-01-23 DIAGNOSIS — I10 Essential (primary) hypertension: Secondary | ICD-10-CM

## 2021-01-23 DIAGNOSIS — M25562 Pain in left knee: Secondary | ICD-10-CM

## 2021-01-23 NOTE — Progress Notes (Deleted)
    SUBJECTIVE:   CHIEF COMPLAINT / HPI:   ***  PERTINENT  PMH / PSH: ***  OBJECTIVE:   BP (!) 173/93   Pulse 68   Temp 98.6 F (37 C) (Oral)   Wt 161 lb (73 kg)   SpO2 99%   BMI 31.44 kg/m   ***  ASSESSMENT/PLAN:   No problem-specific Assessment & Plan notes found for this encounter.     Gerlene Fee, [place]

## 2021-01-23 NOTE — Patient Instructions (Addendum)
Thank for coming in today.  Today we discussed handicap placard for chronic left knee arthritis.  We also discussed calling physical therapy to schedule visits.  At your earliest convenience please send in records for flu shot and shingles vaccine or bring to next visit.  Remember to take your blood pressure medication. Follow up in 2 weeks for a BP recheck.   Dr. Janus Molder

## 2021-01-23 NOTE — Progress Notes (Signed)
   SUBJECTIVE:   CHIEF COMPLAINT / HPI:   Ms. Fuller is a 65 yo F who presents for the below.   Form filled out for a handicap placard Has history of chronic left knee pain and arthritis. She takes tylenol arthritis with minimal relief. She wears ted hose every other day with seems to have improved the pain. Has trouble walking long distances. She has to stop if the pain gets too bad. She believes she could benefit from a handicap placard. She   Hypertension - Medications: Ziac 5-6.25mg  daily, clonidine 0.1 mg qhs - Compliance: Occasionally forgets to take medications and did not take this morning.  - Denies any SOB, CP, vision changes, LE edema, medication SEs, or symptoms of hypotension  PERTINENT  PMH / PSH: Chronic left knee pain, OA  OBJECTIVE:   BP (!) 173/93   Pulse 68   Temp 98.6 F (37 C) (Oral)   Wt 161 lb (73 kg)   SpO2 99%   BMI 31.44 kg/m   General: Appears well, no acute distress. Age appropriate. Cardiac: RRR, normal heart sounds, no murmurs Respiratory: CTAB, normal effort Abdomen: soft, nontender, nondistended Extremities: No edema or cyanosis. Skin: Warm and dry, no rashes noted Neuro: alert and oriented, no focal deficits Psych: normal affect   ASSESSMENT/PLAN:   1. Chronic pain of left knee Briefly discussed PT as an option. Patient declined. Continue tylenol and ted hose.   2. Encounter for completion of form with patient Handicap placard form filled out.   3. Primary hypertension Elevated. Did not take medication this morning. Encouraged compliance. Follow up in 2 weeks for BP recheck.    Gerlene Fee, [place]

## 2021-02-27 ENCOUNTER — Other Ambulatory Visit: Payer: Self-pay

## 2021-02-27 ENCOUNTER — Ambulatory Visit (INDEPENDENT_AMBULATORY_CARE_PROVIDER_SITE_OTHER): Payer: Medicare Other | Admitting: Family Medicine

## 2021-02-27 ENCOUNTER — Encounter: Payer: Self-pay | Admitting: Family Medicine

## 2021-02-27 VITALS — BP 174/93 | HR 60 | Wt 160.0 lb

## 2021-02-27 DIAGNOSIS — I10 Essential (primary) hypertension: Secondary | ICD-10-CM

## 2021-02-27 DIAGNOSIS — M25552 Pain in left hip: Secondary | ICD-10-CM | POA: Diagnosis not present

## 2021-02-27 MED ORDER — BISOPROLOL-HYDROCHLOROTHIAZIDE 5-6.25 MG PO TABS: 2.0000 | ORAL_TABLET | Freq: Every day | ORAL | 0 refills | Status: DC

## 2021-02-27 NOTE — Patient Instructions (Signed)
They recommended today.  Blood pressure remains elevated.  Today we decided to discontinue the clonidine.  We will increase Ziac taking 2 tablets for a total of 5-12.5 mg daily.  Follow-up in 1 week for blood pressure recheck and medication management.  If you continue to have elevated blood pressures with headache, vision changes, or chest pain please let us know.   Dr. Janus Molder

## 2021-02-27 NOTE — Progress Notes (Signed)
° ° °  SUBJECTIVE:   CHIEF COMPLAINT / HPI:   Mrs. Katie Humphrey is a 66 yo F who presents for follow up.   Hypertension - Medications: Ziac 5-6.25mg  daily, clonidine 0.1 mg qhs - Compliance: Yes.  - Checks BP at home avg. 130-150/80-90 - Denies any SOB, CP, vision changes. - Endorses headaches with elevated BPs  Left hip pain A few weeks ago started to have left lateral hip pain. It is much better today but she wanted to mention it to me. She is taking tylenol arthritis with good relief. She denies falls, audbile snap, crackle, or pop.   PERTINENT  PMH / PSH: OA left knee  OBJECTIVE:   BP (!) 174/87    Pulse 70    Wt 160 lb (72.6 kg)    SpO2 100%    BMI 31.25 kg/m   General: Appears well, no acute distress. Age appropriate. Cardiac: RRR, normal heart sounds, no murmurs Respiratory: CTAB, normal effort Extremities:  Left hip exam No deformity.  Normal gait. FROM with 5/5 strength. TTP at left SI joint.  NVI distally. Negative logroll Negative faber, fadir, and piriformis stretches. Lateral leg pain with FADIR.  Skin: Warm and dry, no rashes noted Neuro: alert and oriented Psych: normal affect  ASSESSMENT/PLAN:   Primary hypertension Not well controlled.  Patient endorses medication compliance.  Also endorses headache with elevated pressures at home.  Has doubled Ziac before without significant medication side effects.  Today we will discontinue clonidine and increase Ziac.  Plan to follow-up in 1 week to assess efficacy.  Consider adding or changing to a different medication other than Ziac. - bisoprolol-hydrochlorothiazide (ZIAC) 5-6.25 MG tablet; Take 2 tablets by mouth daily.  Dispense: 90 tablet; Refill: 0  Left hip pain Improving.  No known trauma.  No prior history of surgery on hip.  Normal gait.  Nonpainful weightbearing.  Exam consistent with tight IT band as well as possible tight SI joint.  Discussed continued Tylenol arthritis.  Also demonstrated stretches for IT band  and SI joint.  Patient to do home stretches discussed heat prior to stretching and ice after.  Patient voiced understanding.  Plan to follow-up if continues to be an issue.  Gerlene Fee, [place]

## 2021-03-06 ENCOUNTER — Ambulatory Visit (INDEPENDENT_AMBULATORY_CARE_PROVIDER_SITE_OTHER): Payer: Medicare Other | Admitting: Family Medicine

## 2021-03-06 ENCOUNTER — Other Ambulatory Visit: Payer: Self-pay

## 2021-03-06 ENCOUNTER — Encounter: Payer: Self-pay | Admitting: Family Medicine

## 2021-03-06 VITALS — BP 175/82 | HR 62 | Ht 61.0 in | Wt 158.4 lb

## 2021-03-06 DIAGNOSIS — Z780 Asymptomatic menopausal state: Secondary | ICD-10-CM

## 2021-03-06 DIAGNOSIS — Z13820 Encounter for screening for osteoporosis: Secondary | ICD-10-CM | POA: Diagnosis not present

## 2021-03-06 DIAGNOSIS — I10 Essential (primary) hypertension: Secondary | ICD-10-CM | POA: Diagnosis present

## 2021-03-06 MED ORDER — AMLODIPINE BESYLATE 5 MG PO TABS: 5.0000 mg | ORAL_TABLET | Freq: Every day | ORAL | 0 refills | Status: DC

## 2021-03-06 NOTE — Patient Instructions (Addendum)
It was great seeing you today!  Today you came to follow-up on your blood pressure, and it was elevated today so we added on another medication called amlodipine.  You will take this once a day.  We are also checking your kidney function. I will call you if anything is abnormal, or we will send a MyChart message if normal.  Go to the hospital if you start to feel sudden weakness, chest pain, change in vision, or any new and concerning symptoms.  Please check-out at the front desk before leaving the clinic. I'd like to see you back in 2 weeks to follow-up on your blood pressure and potentially increase the dose of your Amlodipine, but if you need to be seen earlier than that for any new issues we're happy to fit you in, just give [place] a call!  Visit Reminders: - Stop by the pharmacy to pick up your prescriptions  - Continue to work on your healthy eating habits and incorporating exercise into your daily life.  Feel free to call with any questions or concerns at any time, at [phone].   Take care,  Dr. Shary Key [facility]

## 2021-03-06 NOTE — Progress Notes (Signed)
° ° °  SUBJECTIVE:   CHIEF COMPLAINT / HPI:   Katie Humphrey is a 66 yo who presents for blood pressure follow up  HTN: One week ago was started on Bisoprolol-HCTZ 2 tablets of 5-6.25 and was taken off of Clonidine 0.1mg   States she didn't have BP issues until she turned 64. Does not use any added salt on her food.  Is able to walk some- will park farther a way but knee hurts  Last seen on 12/5 and BP was elevated though she had not taken her morning dose.  Today she does report taking her medication and states she has been taking it regularly. She did bring in her BP log from home: This morning before arriving to clinic 144/89, yesterday was 139/84 1/13: 149/91, 149/91  States she has only had headaches twice in the past week, normaly was having headaches daily. Denies headache today, changes in vision, CP, SOB  Health maintenance: Colonoscopy phone call scheduled tomorrow morning   OBJECTIVE:   BP (!) 170/77    Pulse 63    Ht 5\' 1"  (1.549 m)    Wt 158 lb 6.4 oz (71.8 kg)    SpO2 100%    BMI 29.93 kg/m    Physical exam  General: well appearing, NAD Cardiovascular: RRR, no murmurs Lungs: CTAB. Normal WOB Abdomen: soft, non-distended, non-tender Skin: warm, dry. No edema  ASSESSMENT/PLAN:   No problem-specific Assessment & Plan notes found for this encounter.   HTN BP today 170/77, similar on repeat. She was seen 1 week ago in clinic and was started on Bisoprolol-HCTZ 2 tablets of 5-6.25 and was taken off of Clonidine 0.1mg . She endorses less headaches than before. Denies vision changes, CP, SOB. Given her BP is still above goal, we added Amlodipine 5mg . Also checked kidney function given her Bisoprolol-HCTZ was doubled last week. Recommended follow up in 2 weeks.   Health maintenance - Colonoscopy being scheduled tomorrow morning - Order placed for Dexa scan    [place]

## 2021-03-07 LAB — BASIC METABOLIC PANEL
BUN/Creatinine Ratio: 26 (ref 12–28)
BUN: 20 mg/dL (ref 8–27)
CO2: 24 mmol/L (ref 20–29)
Calcium: 10.2 mg/dL (ref 8.7–10.3)
Chloride: 99 mmol/L (ref 96–106)
Creatinine, Ser: 0.78 mg/dL (ref 0.57–1.00)
Glucose: 86 mg/dL (ref 70–99)
Potassium: 4.4 mmol/L (ref 3.5–5.2)
Sodium: 138 mmol/L (ref 134–144)
eGFR: 84 mL/min/{1.73_m2} (ref >59)

## 2021-03-20 ENCOUNTER — Encounter: Payer: Self-pay | Admitting: Family Medicine

## 2021-03-20 ENCOUNTER — Other Ambulatory Visit: Payer: Self-pay

## 2021-03-20 ENCOUNTER — Ambulatory Visit (INDEPENDENT_AMBULATORY_CARE_PROVIDER_SITE_OTHER): Payer: Medicare Other | Admitting: Family Medicine

## 2021-03-20 VITALS — BP 126/80 | HR 68 | Wt 157.0 lb

## 2021-03-20 DIAGNOSIS — I10 Essential (primary) hypertension: Secondary | ICD-10-CM | POA: Diagnosis present

## 2021-03-20 MED ORDER — AMLODIPINE BESYLATE 5 MG PO TABS: 5.0000 mg | ORAL_TABLET | Freq: Every day | ORAL | 0 refills | Status: DC

## 2021-03-20 MED ORDER — BISOPROLOL-HYDROCHLOROTHIAZIDE 5-6.25 MG PO TABS: 2.0000 | ORAL_TABLET | Freq: Every day | ORAL | 3 refills | Status: DC

## 2021-03-20 NOTE — Patient Instructions (Signed)
It was great seeing you today!  You came in to follow-up on your blood pressure, and I am glad to see that it has gone down at home, and that you have been making great changes with your diet.  Keep up the good work!  We will keep your medication as it is, and I have refilled your 2 blood pressure medications.  I will see you back for your next physical, but if you need to be seen earlier than that for any new issues we're happy to fit you in, just give [place] a call!  Visit Reminders: - Stop by the pharmacy to pick up your prescriptions  - Continue to work on your healthy eating habits and incorporating exercise into your daily life.   Feel free to call with any questions or concerns at any time, at [phone].   Take care,  Dr. Shary Key [facility]

## 2021-03-20 NOTE — Progress Notes (Signed)
° ° °  SUBJECTIVE:   CHIEF COMPLAINT / HPI:   [name] is a 66 yo who presents for HTN follow up. At home she states her BP has been great and shows a picture of her log from her phone including 119/78, 120/71. Currently taking Bisoprolol-HCTZ 2 tablets of 5-6.25. Amlodipine 5mg  was added 2 weeks. She reports feeling great over these past couple of weeks, without headaches, and states she has been using Ms. Dash on her food, avoiding sal. Has been doing exercise, walking around. She is eating more fruits and vegetables, less processed food.    BP 151/84 and on repeat 126/80   OBJECTIVE:   BP 126/80    Pulse 68    Wt 157 lb (71.2 kg)    SpO2 98%    BMI 29.66 kg/m    Physical exam General: well appearing, NAD Cardiovascular: RRR, no murmurs Lungs: CTAB. Normal WOB Abdomen: soft, non-distended Skin: warm, dry. No edema  ASSESSMENT/PLAN:   No problem-specific Assessment & Plan notes found for this encounter.  HTN BP today 151/84 and on repeat 126/80. Home measurements all normotensive. Current regimen includes Bisoprolol-HCTZ 2 tablets of 5-6.25 as well as Amlodipine 5mg  that was added 2 weeks ago. Patient is stable and feels well, has been incorporating a low salt diet and well as increased physical activity. Endorses improvement with her headaches and overall wellness. Kidney function checked 2 weeks ago with normal Cr of 0.78. Will continue current regimen without any adjustments to her medication. Sent in refills.   Health maintenance - Dexa scan next Tuesday at 8:30 - Colonocopy in July  - Discussed getting Shingrix vaccine from [place], [place]

## 2021-03-28 ENCOUNTER — Ambulatory Visit
Admission: RE | Admit: 2021-03-28 | Discharge: 2021-03-28 | Disposition: A | Discharge status: Home / Self Care | Payer: Medicare Other | Source: Ambulatory Visit | Attending: Family Medicine | Admitting: Family Medicine

## 2021-03-28 ENCOUNTER — Other Ambulatory Visit: Payer: Self-pay

## 2021-03-28 DIAGNOSIS — Z13820 Encounter for screening for osteoporosis: Secondary | ICD-10-CM

## 2021-03-28 DIAGNOSIS — Z780 Asymptomatic menopausal state: Secondary | ICD-10-CM

## 2021-04-22 ENCOUNTER — Other Ambulatory Visit: Payer: Self-pay | Admitting: Family Medicine

## 2021-05-08 ENCOUNTER — Other Ambulatory Visit: Payer: Self-pay | Admitting: Family Medicine

## 2021-05-08 DIAGNOSIS — I10 Essential (primary) hypertension: Secondary | ICD-10-CM

## 2021-07-25 ENCOUNTER — Encounter: Payer: Self-pay | Admitting: *Deleted

## 2021-08-28 ENCOUNTER — Other Ambulatory Visit: Payer: Self-pay | Admitting: Family Medicine

## 2021-08-28 DIAGNOSIS — Z1231 Encounter for screening mammogram for malignant neoplasm of breast: Secondary | ICD-10-CM

## 2021-09-04 ENCOUNTER — Ambulatory Visit: Payer: Medicare Other

## 2021-10-04 ENCOUNTER — Other Ambulatory Visit: Payer: Self-pay | Admitting: Family Medicine

## 2021-10-04 DIAGNOSIS — I10 Essential (primary) hypertension: Secondary | ICD-10-CM

## 2021-10-09 ENCOUNTER — Ambulatory Visit (INDEPENDENT_AMBULATORY_CARE_PROVIDER_SITE_OTHER): Payer: Medicare Other | Admitting: Family Medicine

## 2021-10-09 ENCOUNTER — Encounter: Payer: Self-pay | Admitting: Family Medicine

## 2021-10-09 VITALS — BP 124/68 | HR 77 | Ht 62.0 in | Wt 158.0 lb

## 2021-10-09 DIAGNOSIS — M810 Age-related osteoporosis without current pathological fracture: Secondary | ICD-10-CM | POA: Diagnosis not present

## 2021-10-09 DIAGNOSIS — I10 Essential (primary) hypertension: Secondary | ICD-10-CM | POA: Diagnosis not present

## 2021-10-09 DIAGNOSIS — J302 Other seasonal allergic rhinitis: Secondary | ICD-10-CM

## 2021-10-09 MED ORDER — ALENDRONATE SODIUM 70 MG PO TABS: 70.0000 mg | ORAL_TABLET | ORAL | 11 refills | Status: DC

## 2021-10-09 NOTE — Progress Notes (Signed)
    SUBJECTIVE:   CHIEF COMPLAINT / HPI:   Hypertension  She's been walking in the afternoons for 2 miles a day. She takes water with her. She mentions rarely she feels like she has low blood pressure but she sits down if she has to. No headaches.  She checks her blood pressure once a week at home, especially if she gets a headache.  She gets headaches rarely notices its when she wears her head band.   Denies swelling in her legs.   Colonoscopy September 21st   Takes vitamin D ( womens silver nitrate)  Has never broken any bones   PERTINENT  PMH / PSH: Hypertension, Allergic Rhinitis, Osteoarthritis   OBJECTIVE:   BP 124/68   Pulse 77   Ht '5\' 2"'$  (1.575 m)   Wt 158 lb (71.7 kg)   SpO2 98%   BMI 28.90 kg/m   General: well appearing healthy older lady  CV: RRR, No m/r/g, well perfused, trace edema in lower extremities up to mid calves bilaterally  Pulm: CTAB, normal work of breathing  Abd: non distended, non tender to palpation, soft   ASSESSMENT/PLAN:   Hypertension BP today is well controlled.  - Continue lifestyle efforts like walking and using Ms. Dash  - Continue amlodipine and bisoprolol- HCTZ  Allergic rhinitis Continue as needed medications.  - Use sparingly as it does cause more side effects in older adults.   Osteoporosis DEXA scan in 03/2021 showed osteoporosis with Z score -1/7 at left femoral neck.  - Continue taking multivitamin with vitamin D and calcium  - Start alendronate weekly (provided education with how to properly take medication) (wants to discuss 6 month injection at a later time)  - follow up in a month to address any side effects  - DEXA scan every 2 years to track progression  - Start doing strength exercises for bone health      Lowry Ram, MD [place]

## 2021-10-09 NOTE — Assessment & Plan Note (Signed)
BP today is well controlled.  - Continue lifestyle efforts like walking and using Ms. Dash  - Continue amlodipine and bisoprolol- HCTZ

## 2021-10-09 NOTE — Assessment & Plan Note (Signed)
Continue as needed medications.  - Use sparingly as it does cause more side effects in older adults.

## 2021-10-09 NOTE — Patient Instructions (Signed)
It was great to see you today! Thank you for choosing [facility] for your primary care. Katie Humphrey was seen for office visit.  Today we addressed: Hypertension - You are doing a GREAT job. Keep doing your walking!  Health maintenance - make sure you get your colonoscopy on September 21st.  Osteoporosis - Continue taking your vitamin d supplementation (try taking it with fatty foods like nuts or avocados this helps the absorption). I am starting a new medication called Alendronate. This will help the osteoporosis from progressing. This is a once weekly medication. There are very specific instructions for how to take the medication (on an empty stomach with a full glass of water and stay sitting upright for 30 minutes) to prevent throat irritation. We will get another scan in about 2 years to check your bone health.   If you haven't already, sign up for My Chart to have easy access to your labs results, and communication with your primary care physician.  We are checking some labs today. If they are abnormal, I will call you. If they are normal, I will send you a MyChart message (if it is active) or a letter in the mail. If you do not hear about your labs in the next 2 weeks, please call the office.   You should return to our clinic Return in about 4 weeks (around 11/06/2021) for started new med .  I recommend that you always bring your medications to each appointment as this makes it easy to ensure you are on the correct medications and helps [place] not miss refills when you need them.  Please arrive 15 minutes before your appointment to ensure smooth check in process.  We appreciate your efforts in making this happen.  Please call the clinic at [phone] if your symptoms worsen or you have any concerns.  Thank you for allowing me to participate in your care, Lowry Ram, MD 10/09/2021, 11:45 AM PGY-1, [place]

## 2021-10-09 NOTE — Assessment & Plan Note (Signed)
DEXA scan in 03/2021 showed osteoporosis with Z score -1/7 at left femoral neck.  - Continue taking multivitamin with vitamin D and calcium  - Start alendronate weekly (provided education with how to properly take medication) (wants to discuss 6 month injection at a later time)  - follow up in a month to address any side effects  - DEXA scan every 2 years to track progression  - Start doing strength exercises for bone health

## 2021-11-10 ENCOUNTER — Ambulatory Visit: Payer: Medicare Other | Admitting: Family Medicine

## 2021-12-18 ENCOUNTER — Ambulatory Visit: Payer: Medicare Other

## 2022-03-12 ENCOUNTER — Other Ambulatory Visit: Payer: Self-pay | Admitting: Family Medicine

## 2022-03-12 DIAGNOSIS — Z1231 Encounter for screening mammogram for malignant neoplasm of breast: Secondary | ICD-10-CM

## 2022-03-26 ENCOUNTER — Encounter: Payer: Self-pay | Admitting: Family Medicine

## 2022-03-26 ENCOUNTER — Ambulatory Visit (INDEPENDENT_AMBULATORY_CARE_PROVIDER_SITE_OTHER): Payer: Medicare Other | Admitting: Family Medicine

## 2022-03-26 VITALS — BP 124/68 | HR 79 | Ht 62.0 in | Wt 159.0 lb

## 2022-03-26 DIAGNOSIS — I10 Essential (primary) hypertension: Secondary | ICD-10-CM | POA: Diagnosis not present

## 2022-03-26 DIAGNOSIS — Z1211 Encounter for screening for malignant neoplasm of colon: Secondary | ICD-10-CM

## 2022-03-26 DIAGNOSIS — M810 Age-related osteoporosis without current pathological fracture: Secondary | ICD-10-CM

## 2022-03-26 DIAGNOSIS — Z23 Encounter for immunization: Secondary | ICD-10-CM

## 2022-03-26 MED ORDER — ALENDRONATE SODIUM 70 MG PO TABS: 70.0000 mg | ORAL_TABLET | ORAL | 11 refills | Status: AC

## 2022-03-26 MED ORDER — AMLODIPINE BESYLATE 5 MG PO TABS: 5.0000 mg | ORAL_TABLET | Freq: Every day | ORAL | 2 refills | Status: DC

## 2022-03-26 MED ORDER — BISOPROLOL-HYDROCHLOROTHIAZIDE 5-6.25 MG PO TABS: 2.0000 | ORAL_TABLET | Freq: Every day | ORAL | 3 refills | Status: DC

## 2022-03-26 MED ORDER — ZOSTER VAC RECOMB ADJUVANTED 50 MCG/0.5ML IM SUSR: 0.5000 mL | Freq: Once | INTRAMUSCULAR | 0 refills | Status: DC

## 2022-03-26 MED ORDER — ZOSTER VAC RECOMB ADJUVANTED 50 MCG/0.5ML IM SUSR: 0.5000 mL | Freq: Once | INTRAMUSCULAR | 0 refills | Status: AC

## 2022-03-26 NOTE — Progress Notes (Signed)
    SUBJECTIVE:   CHIEF COMPLAINT / HPI:   Medication refills Patient today has no complaints.  She is simply here to get her medications refilled.  She has not had any new symptoms or conditions since her last visit.  She does note she has not been taking her alendronate because it was sent to the wrong pharmacy, but she is amenable to starting to take it now.  Health maintenance  She is interested in receiving vaccines today.  She was also unable to get her colonoscopy because she had to take her friend to their colonoscopy, and now she has to pay $300 for cancellation fee.  She is now wanting to go to [facility] to get the procedure done.  PERTINENT  PMH / PSH: HTN, allergic rhinitis, osteoarthritis, osteoporosis  OBJECTIVE:   BP 124/68   Pulse 79   Ht '5\' 2"'$  (1.575 m)   Wt 159 lb (72.1 kg)   SpO2 97%   BMI 29.08 kg/m   General: Alert and oriented, in NAD Skin: Warm, dry, and intact without lesions HEENT: NCAT, EOM grossly normal, midline nasal septum Cardiac: Regular rate Respiratory: Breathing and speaking comfortably on RA Extremities: Moves all extremities grossly equally Neurological: No gross focal deficit Psychiatric: Appropriate mood and affect  ASSESSMENT/PLAN:   Hypertension Controlled.  Refilled amlodipine and Ziac.  Consider updated BMP at next visit.  Osteoporosis Has not been taking alendronate.  Sent to preferred pharmacy.  Follow-up tolerance at next visit.  Health maintenance Flu shot given. Covid shot not given due to no supply today. Shingles vaccine Rx given for use at preferred pharmacy. Colonoscopy referral sent.   Ethelene Hal, MD [place]

## 2022-03-26 NOTE — Assessment & Plan Note (Addendum)
Controlled.  Refilled amlodipine and Ziac.  Consider updated BMP at next visit.

## 2022-03-26 NOTE — Assessment & Plan Note (Addendum)
Has not been taking alendronate.  Sent to preferred pharmacy.  Follow-up tolerance at next visit.

## 2022-03-26 NOTE — Patient Instructions (Addendum)
It was great to see you today! Here's what we talked about:  I refilled your blood pressure medications and sent in the alendronate (for your bones) to your pharmacy. You received your flu vaccine today. I have given you a prescription for the shingles vaccine. Take this to your pharmacy to have it done. I have placed an order for colonoscopy to be completed wherever you'd like. Just call and schedule with them.  Please let me know if you have any other questions.  Dr. Marcha Dutton

## 2022-03-27 ENCOUNTER — Ambulatory Visit: Payer: Medicare Other | Admitting: Family Medicine

## 2022-04-08 NOTE — Progress Notes (Unsigned)
I connected with  Katie Humphrey on 04/09/2022 by a audio enabled telemedicine application and verified that I am speaking with the correct person using two identifiers.  Patient Location: Home  Provider Location: Home Office  I discussed the limitations of evaluation and management by telemedicine. The patient expressed understanding and agreed to proceed.  Subjective:   Katie Humphrey is a 67 y.o. female who presents for an Initial Medicare Annual Wellness Visit.  Review of Systems    Cardiac Risk Factors include: advanced age (>27mn, >>49women);female gender, and Hypertension.        Objective:       03/26/2022    1:33 PM 10/09/2021   11:06 AM 03/20/2021    3:43 PM  Vitals with BMI  Height 5' 2"$  5' 2"$    Weight 159 lbs 158 lbs   BMI [id number]  Systolic [id number] Diastolic 68 68 80  Pulse 79 77     There were no vitals filed for this visit. There is no height or weight on file to calculate BMI.     03/26/2022    1:34 PM 10/09/2021   11:08 AM 03/20/2021    3:12 PM 12/05/2020   10:43 AM 08/29/2020    1:48 PM 04/18/2020    1:37 PM 03/30/2019    2:04 PM  Advanced Directives  Does Patient Have a Medical Advance Directive? No No No No No No No  Would patient like information on creating a medical advance directive? No - Patient declined No - Patient declined No - Patient declined No - Patient declined No - Patient declined No - Patient declined No - Patient declined    Current Medications (verified) Outpatient Encounter Medications as of 04/09/2022  Medication Sig   alendronate (FOSAMAX) 70 MG tablet Take 1 tablet (70 mg total) by mouth every 7 (seven) days. Take with a full glass of water on an empty stomach.   amLODipine (NORVASC) 5 MG tablet Take 1 tablet (5 mg total) by mouth daily.   bisoprolol-hydrochlorothiazide (ZIAC) 5-6.25 MG tablet Take 2 tablets by mouth daily.   loratadine (CLARITIN) 10 MG tablet Take 1 tablet (10 mg total) by mouth daily as needed  for allergies.   Multiple Vitamins-Minerals (VITAMIN-MINERAL SUPPLEMENT PO) Take by mouth.   Tetrahydrozoline HCl (VISINE OP) Place 1 drop into both eyes as needed (as needed for allergies).   No facility-administered encounter medications on file as of 04/09/2022.    Allergies (verified) Patient has no known allergies.   History: Past Medical History:  Diagnosis Date   Anxiety 11/30/2011   Back pain 01/19/2015   Chest pain 05/18/2015   Headache    hx of migraines, stopped years ago   Hypertension    OA (osteoarthritis) of knee 11/30/2011   Past Surgical History:  Procedure Laterality Date   MASS EXCISION Right 07/28/2015   Procedure: EXCISION MASS RIGHT LEG ;  Surgeon: PAutumn MessingIII, MD;  Location: [facility] OR;  Service: General;  Laterality: Right;   TUBAL LIGATION     Family History  Problem Relation Age of Onset   COPD Mother    CAD Mother    Heart failure Father    Social History   Socioeconomic History   Marital status: Married    Spouse name: Not on file   Number of children: Not on file   Years of education: Not on file   Highest education level: Not on file  Occupational History  Not on file  Tobacco Use   Smoking status: Never   Smokeless tobacco: Never  Substance and Sexual Activity   Alcohol use: No   Drug use: No   Sexual activity: Not Currently  Other Topics Concern   Not on file  Social History Narrative   Not on file   Social Determinants of Health   Financial Resource Strain: Not on file  Food Insecurity: Not on file  Transportation Needs: Not on file  Physical Activity: Not on file  Stress: Not on file  Social Connections: Not on file    Tobacco Counseling Counseling given: Not Answered   Clinical Intake:                 Diabetic?No         Activities of Daily Living     No data to display          Patient Care Team: Shary Key, DO as PCP - General (Family Medicine)  Indicate any recent Medical Services  you may have received from other than [facility] providers in the past year (date may be approximate).     Assessment:   This is a routine wellness examination for Camia.  Hearing/Vision screen Denies any hearing issues. Denies any vision changes. Annual Eye Exam Dietary issues and exercise activities discussed:     Goals Addressed   None    Depression Screen    03/26/2022    1:34 PM 10/09/2021   11:07 AM 03/20/2021    3:12 PM 12/05/2020   10:43 AM 04/18/2020    1:37 PM 03/30/2019    2:05 PM 10/08/2017    2:40 PM  PHQ 2/9 Scores  PHQ - 2 Score 0 0 0 0 0 0 0  PHQ- 9 Score 2 0 0 1 0      Fall Risk    03/26/2022    1:34 PM 10/09/2021   11:08 AM 03/20/2021    3:12 PM 04/18/2020    1:37 PM 03/30/2019    2:05 PM  Fall Risk   Falls in the past year? 0 0 0 1 0  Number falls in past yr: 0  0 1   Injury with Fall?   0 1     FALL RISK PREVENTION PERTAINING TO THE HOME:  Any stairs in or around the home? {YES/NO:21197} If so, are there any without handrails? {YES/NO:21197} Home free of loose throw rugs in walkways, pet beds, electrical Humphrey, etc? {YES/NO:21197} Adequate lighting in your home to reduce risk of falls? {YES/NO:21197}  ASSISTIVE DEVICES UTILIZED TO PREVENT FALLS:  Life alert? {YES/NO:21197} Use of a cane, walker or w/c? {YES/NO:21197} Grab bars in the bathroom? {YES/NO:21197} Shower chair or bench in shower? {YES/NO:21197} Elevated toilet seat or a handicapped toilet? {YES/NO:21197}  TIMED UP AND GO:  Was the test performed? Unable to perform, virtual appointment    Cognitive Function:        Immunizations Immunization History  Administered Date(s) Administered   Fluad Quad(high Dose 65+) 03/26/2022   Influenza,inj,Quad PF,6+ Mos 12/26/2017   Influenza-Unspecified 12/08/2018   PFIZER Comirnaty(Gray Top)Covid-19 Tri-Sucrose Vaccine 05/04/2019, 05/25/2019, 12/28/2019, 01/16/2021   PNEUMOCOCCAL CONJUGATE-20 08/29/2020    TDAP status: Due, Education has been  provided regarding the importance of this vaccine. Advised may receive this vaccine at local pharmacy or Health Dept. Aware to provide a copy of the vaccination record if obtained from local pharmacy or Health Dept. Verbalized acceptance and understanding.  Flu Vaccine status: Up to date  Pneumococcal vaccine status: Up to date  Covid-19 vaccine status: Information provided on how to obtain vaccines.   Qualifies for Shingles Vaccine? Yes   Zostavax completed No   Shingrix Completed?: No.    Education has been provided regarding the importance of this vaccine. Patient has been advised to call insurance company to determine out of pocket expense if they have not yet received this vaccine. Advised may also receive vaccine at local pharmacy or Health Dept. Verbalized acceptance and understanding.  Screening Tests Health Maintenance  Topic Date Due   Medicare Annual Wellness (AWV)  Never done   DTaP/Tdap/Td (1 - Tdap) Never done   Zoster Vaccines- Shingrix (1 of 2) Never done   COLONOSCOPY (Pts 45-7yr Insurance coverage will need to be confirmed)  02/20/2015   COVID-19 Vaccine (5 - 2023-24 season) 04/23/2022 (Originally 10/20/2021)   MAMMOGRAM  08/30/2022   DEXA SCAN  03/29/2023   Pneumonia Vaccine 67 Years old  Completed   INFLUENZA VACCINE  Completed   Hepatitis C Screening  Completed   HPV VACCINES  Aged Out    Health Maintenance  Health Maintenance Due  Topic Date Due   Medicare Annual Wellness (AWV)  Never done   DTaP/Tdap/Td (1 - Tdap) Never done   Zoster Vaccines- Shingrix (1 of 2) Never done   COLONOSCOPY (Pts 45-42yrInsurance coverage will need to be confirmed)  02/20/2015    Colorectal cancer screening: Referral to GI placed 03/26/2022. Pt aware the office will call re: appt.  Mammogram status: Completed 08/29/2020. Repeat every year. Scheduled for 04/30/2022  DEXA Scan: 03/28/2021  Lung Cancer Screening: (Low Dose CT Chest recommended if Age 582-80ears, 30  pack-year currently smoking OR have quit w/in 15years.) does not qualify.   Lung Cancer Screening Referral: not applicable  Additional Screening:  Hepatitis C Screening: does not qualify; Completed 09/17/201  Vision Screening: Recommended annual ophthalmology exams for early detection of glaucoma and other disorders of the eye. Is the patient up to date with their annual eye exam?  {YES/NO:21197} Who is the provider or what is the name of the office in which the patient attends annual eye exams? *** If pt is not established with a provider, would they like to be referred to a provider to establish care? {YES/NO:21197}.   Dental Screening: Recommended annual dental exams for proper oral hygiene  Community Resource Referral / Chronic Care Management: CRR required this visit?  No   CCM required this visit?  No      Plan:     I have personally reviewed and noted the following in the patient's chart:   Medical and social history Use of alcohol, tobacco or illicit drugs  Current medications and supplements including opioid prescriptions. Patient is not currently taking opioid prescriptions. Functional ability and status Nutritional status Physical activity Advanced directives List of other physicians Hospitalizations, surgeries, and ER visits in previous 12 months Vitals Screenings to include cognitive, depression, and falls Referrals and appointments  In addition, I have reviewed and discussed with patient certain preventive protocols, quality metrics, and best practice recommendations. A written personalized care plan for preventive services as well as general preventive health recommendations were provided to patient.    Ms. DeVantine Thank you for taking time to come for your Medicare Wellness Visit. I appreciate your ongoing commitment to your health goals. Please review the following plan we discussed and let me know if I can assist you in the future.   These are the goals  we discussed:  Goals      Blood Pressure < 140/90        This is a list of the screening recommended for you and due dates:  Health Maintenance  Topic Date Due   DTaP/Tdap/Td vaccine (1 - Tdap) Never done   Zoster (Shingles) Vaccine (1 of 2) Never done   Colon Cancer Screening  02/20/2015   COVID-19 Vaccine (5 - 2023-24 season) 04/23/2022*   Mammogram  08/30/2022   DEXA scan (bone density measurement)  03/29/2023   Medicare Annual Wellness Visit  04/10/2023   Pneumonia Vaccine  Completed   Flu Shot  Completed   Hepatitis C Screening: USPSTF Recommendation to screen - Ages 18-79 yo.  Completed   HPV Vaccine  Aged Out  *Topic was postponed. The date shown is not the original due date.     Wilson Singer, [place]   04/09/2022  Nurse Notes: Approximately 30 minute Non-Face -To-Face Medicare Wellness Visit

## 2022-04-08 NOTE — Patient Instructions (Signed)

## 2022-04-09 ENCOUNTER — Ambulatory Visit (INDEPENDENT_AMBULATORY_CARE_PROVIDER_SITE_OTHER): Payer: Medicare Other

## 2022-04-09 VITALS — BP 112/68 | HR 72

## 2022-04-09 DIAGNOSIS — Z0000 Encounter for general adult medical examination without abnormal findings: Secondary | ICD-10-CM | POA: Diagnosis not present

## 2022-04-30 ENCOUNTER — Ambulatory Visit
Admission: RE | Admit: 2022-04-30 | Discharge: 2022-04-30 | Disposition: A | Discharge status: Home / Self Care | Payer: Medicare Other | Source: Ambulatory Visit | Attending: Family Medicine | Admitting: Family Medicine

## 2022-04-30 DIAGNOSIS — Z1231 Encounter for screening mammogram for malignant neoplasm of breast: Secondary | ICD-10-CM

## 2022-05-21 ENCOUNTER — Ambulatory Visit: Payer: Medicare Other | Admitting: Family Medicine

## 2022-05-21 NOTE — Progress Notes (Deleted)
    SUBJECTIVE:   Chief compliant/HPI: annual examination  Katie Humphrey is a 67 y.o. who presents today for an annual exam.   Hypertension: - Medications: Amlodipine and Ziac - Compliance: *** - Checking BP at home: *** - Denies any SOB, CP, vision changes, LE edema, medication SEs, or symptoms of hypotension - Diet: *** - Exercise: ***   *BMP, A1c, lipid, calcium< vit D? *Last DEXA 03/2021 - looked good Colonoscopy referral - 03/2022  History tabs reviewed and updated ***.   Review of systems form reviewed and notable for ***.   OBJECTIVE:   There were no vitals taken for this visit.  ***  ASSESSMENT/PLAN:   No problem-specific Assessment & Plan notes found for this encounter.    Annual Examination  See AVS for age appropriate recommendations  PHQ score ***, reviewed and discussed.  BP reviewed and at goal ***.  Asked about intimate partner violence and resources given as appropriate  Advance directives discussion ***  Considered the following items based upon USPSTF recommendations: Diabetes screening: {discussed/ordered:14545} Screening for elevated cholesterol: {discussed/ordered:14545} HIV testing: {discussed/ordered:14545} Hepatitis C: {discussed/ordered:14545} Hepatitis B: {discussed/ordered:14545} Syphilis if at high risk: {discussed/ordered:14545} GC/CT {GC/CT screening :23818} DEXA {ordered not order:23822}. H/o osteoporosis Reviewed risk factors for latent tuberculosis and {not indicated/requested/declined:14582}   Discussed family history, BRCA testing {not indicated/requested/declined:14582}. Tool used to risk stratify was ***.  Cervical cancer screening: {PAPTYPE:23819} Breast cancer screening: {mammoscreen:23820} Colorectal cancer screening: {crcscreen:23821::"discussed, colonoscopy ordered"} Lung cancer screening: {discussed/declined/written info:19698}. See documentation below regarding indications/risks/benefits.  Vaccinations ***.    Follow up in 1 *** year or sooner if indicated.    Colletta Maryland, MD [place]

## 2022-06-30 ENCOUNTER — Other Ambulatory Visit: Payer: Self-pay | Admitting: Family Medicine

## 2022-08-13 ENCOUNTER — Other Ambulatory Visit: Payer: Self-pay

## 2022-08-13 ENCOUNTER — Ambulatory Visit (INDEPENDENT_AMBULATORY_CARE_PROVIDER_SITE_OTHER): Payer: Medicare Other | Admitting: Family Medicine

## 2022-08-13 VITALS — BP 127/65 | HR 68 | Ht 61.5 in | Wt 137.0 lb

## 2022-08-13 DIAGNOSIS — I10 Essential (primary) hypertension: Secondary | ICD-10-CM | POA: Diagnosis not present

## 2022-08-13 DIAGNOSIS — Z1211 Encounter for screening for malignant neoplasm of colon: Secondary | ICD-10-CM | POA: Diagnosis not present

## 2022-08-13 LAB — POCT GLYCOSYLATED HEMOGLOBIN (HGB A1C): Hemoglobin A1C: 5.2 % (ref 4.0–5.6)

## 2022-08-13 NOTE — Progress Notes (Unsigned)
    SUBJECTIVE:   CHIEF COMPLAINT / HPI:   Patient presents for a check up. Denies any particular  concerns   Exercises 3x a week by walking Eats well balanced meals   Denies tobacco, alcohol, rec drug use   BP:   PERTINENT  PMH / PSH: Reviewed   OBJECTIVE:   BP 127/65   Pulse 68   Ht 5' 1.5" (1.562 m)   Wt 137 lb (62.1 kg)   SpO2 100%   BMI 25.47 kg/m    Physical exam General: well appearing, NAD Cardiovascular: RRR, no murmurs Lungs: CTAB. Normal WOB Abdomen: soft, non-distended, non-tender Skin: warm, dry. No edema  ASSESSMENT/PLAN:   No problem-specific Assessment & Plan notes found for this encounter.    HTN Vitals  - BMP  Lipid panel  A1c   Health maintenance  Due for colonoscopy but would prefer to do FIT testing instead  Tdap   Cora Collum, DO [facility]

## 2022-08-13 NOTE — Patient Instructions (Addendum)
It was great seeing you today!  Im glad everything is going well!   We will check some blood work and set you up for the colon cancer screen. I will call you if anything is abnormal.   Feel free to call with any questions or concerns at any time, at [phone].   Take care,  Dr. Cora Collum [facility]

## 2022-08-14 ENCOUNTER — Telehealth: Payer: Self-pay | Admitting: Family Medicine

## 2022-08-14 LAB — BASIC METABOLIC PANEL
BUN/Creatinine Ratio: 20 (ref 12–28)
BUN: 18 mg/dL (ref 8–27)
CO2: 27 mmol/L (ref 20–29)
Calcium: 10 mg/dL (ref 8.7–10.3)
Chloride: 101 mmol/L (ref 96–106)
Creatinine, Ser: 0.89 mg/dL (ref 0.57–1.00)
Glucose: 95 mg/dL (ref 70–99)
Potassium: 3.7 mmol/L (ref 3.5–5.2)
Sodium: 140 mmol/L (ref 134–144)
eGFR: 71 mL/min/{1.73_m2} (ref >59)

## 2022-08-14 LAB — CBC
Hematocrit: 32.4 % — ABNORMAL LOW (ref 34.0–46.6)
Hemoglobin: 10.5 g/dL — ABNORMAL LOW (ref 11.1–15.9)
MCH: 32.8 pg (ref 26.6–33.0)
MCHC: 32.4 g/dL (ref 31.5–35.7)
MCV: 101 fL — ABNORMAL HIGH (ref 79–97)
Platelets: 233 10*3/uL (ref 150–450)
RBC: 3.2 x10E6/uL — ABNORMAL LOW (ref 3.77–5.28)
RDW: 12.5 % (ref 11.7–15.4)
WBC: 6 10*3/uL (ref 3.4–10.8)

## 2022-08-14 LAB — LIPID PANEL
Chol/HDL Ratio: 2.4 ratio (ref 0.0–4.4)
Cholesterol, Total: 189 mg/dL (ref 100–199)
HDL: 78 mg/dL (ref >39)
LDL Chol Calc (NIH): 92 mg/dL (ref 0–99)
Triglycerides: 109 mg/dL (ref 0–149)
VLDL Cholesterol Cal: 19 mg/dL (ref 5–40)

## 2022-08-14 MED ORDER — FERROUS SULFATE 325 (65 FE) MG PO TABS: 325.0000 mg | ORAL_TABLET | ORAL | 0 refills | Status: AC

## 2022-08-14 NOTE — Telephone Encounter (Signed)
Hgb low at 10.5. Ordered po iron to take every other day. Will recheck in 2 months. Discussed with patient.

## 2022-08-14 NOTE — Assessment & Plan Note (Signed)
BP 127/65. Controlled. Current medication: Amlodpine 5mg  daily, Bisoprolol-hydrochlorothiazide 5-6.25 twice daily - continue current regimen  - BMP

## 2022-09-03 ENCOUNTER — Other Ambulatory Visit: Payer: Medicare Other

## 2022-09-03 ENCOUNTER — Other Ambulatory Visit: Payer: Self-pay

## 2022-09-03 DIAGNOSIS — Z1211 Encounter for screening for malignant neoplasm of colon: Secondary | ICD-10-CM

## 2022-09-05 LAB — FECAL OCCULT BLOOD, IMMUNOCHEMICAL: Fecal Occult Bld: NEGATIVE

## 2022-09-27 ENCOUNTER — Other Ambulatory Visit: Payer: Self-pay | Admitting: Family Medicine

## 2022-09-27 DIAGNOSIS — I10 Essential (primary) hypertension: Secondary | ICD-10-CM

## 2022-10-05 ENCOUNTER — Other Ambulatory Visit: Payer: Self-pay

## 2022-10-06 MED ORDER — AMLODIPINE BESYLATE 5 MG PO TABS: 5.0000 mg | ORAL_TABLET | Freq: Every day | ORAL | 2 refills | Status: DC

## 2023-02-22 ENCOUNTER — Ambulatory Visit: Payer: Self-pay | Admitting: Family Medicine

## 2023-02-23 ENCOUNTER — Other Ambulatory Visit: Payer: Self-pay | Admitting: Student

## 2023-02-23 DIAGNOSIS — I10 Essential (primary) hypertension: Secondary | ICD-10-CM

## 2023-04-08 ENCOUNTER — Other Ambulatory Visit: Payer: Self-pay | Admitting: Student

## 2023-04-08 DIAGNOSIS — I10 Essential (primary) hypertension: Secondary | ICD-10-CM

## 2023-04-19 ENCOUNTER — Emergency Department (HOSPITAL_COMMUNITY)
Admission: EM | Admit: 2023-04-19 | Discharge: 2023-04-19 | Disposition: A | Discharge status: Home / Self Care | Payer: Self-pay | Attending: Emergency Medicine | Admitting: Emergency Medicine

## 2023-04-19 ENCOUNTER — Emergency Department (HOSPITAL_COMMUNITY): Payer: Self-pay

## 2023-04-19 ENCOUNTER — Other Ambulatory Visit: Payer: Self-pay

## 2023-04-19 ENCOUNTER — Encounter (HOSPITAL_COMMUNITY): Payer: Self-pay

## 2023-04-19 DIAGNOSIS — R42 Dizziness and giddiness: Secondary | ICD-10-CM | POA: Diagnosis present

## 2023-04-19 DIAGNOSIS — I10 Essential (primary) hypertension: Secondary | ICD-10-CM | POA: Diagnosis not present

## 2023-04-19 DIAGNOSIS — Z79899 Other long term (current) drug therapy: Secondary | ICD-10-CM | POA: Insufficient documentation

## 2023-04-19 LAB — URINALYSIS, W/ REFLEX TO CULTURE (INFECTION SUSPECTED)
Bacteria, UA: NONE SEEN
Bilirubin Urine: NEGATIVE
Glucose, UA: NEGATIVE mg/dL
Hgb urine dipstick: NEGATIVE
Ketones, ur: NEGATIVE mg/dL
Leukocytes,Ua: NEGATIVE
Nitrite: NEGATIVE
Protein, ur: NEGATIVE mg/dL
Specific Gravity, Urine: 1.009 (ref 1.005–1.030)
pH: 7 (ref 5.0–8.0)

## 2023-04-19 LAB — COMPREHENSIVE METABOLIC PANEL
ALT: 12 U/L (ref 0–44)
AST: 21 U/L (ref 15–41)
Albumin: 3.8 g/dL (ref 3.5–5.0)
Alkaline Phosphatase: 56 U/L (ref 38–126)
Anion gap: 11 (ref 5–15)
BUN: 19 mg/dL (ref 8–23)
CO2: 24 mmol/L (ref 22–32)
Calcium: 9.8 mg/dL (ref 8.9–10.3)
Chloride: 106 mmol/L (ref 98–111)
Creatinine, Ser: 0.81 mg/dL (ref 0.44–1.00)
GFR, Estimated: >60 mL/min (ref >60)
Glucose, Bld: 107 mg/dL — ABNORMAL HIGH (ref 70–99)
Potassium: 3.7 mmol/L (ref 3.5–5.1)
Sodium: 141 mmol/L (ref 135–145)
Total Bilirubin: 0.4 mg/dL (ref 0.0–1.2)
Total Protein: 7.3 g/dL (ref 6.5–8.1)

## 2023-04-19 LAB — CBC
HCT: 33.5 % — ABNORMAL LOW (ref 36.0–46.0)
Hemoglobin: 10.8 g/dL — ABNORMAL LOW (ref 12.0–15.0)
MCH: 34 pg (ref 26.0–34.0)
MCHC: 32.2 g/dL (ref 30.0–36.0)
MCV: 105.3 fL — ABNORMAL HIGH (ref 80.0–100.0)
Platelets: 244 10*3/uL (ref 150–400)
RBC: 3.18 MIL/uL — ABNORMAL LOW (ref 3.87–5.11)
RDW: 13.5 % (ref 11.5–15.5)
WBC: 6.6 10*3/uL (ref 4.0–10.5)
nRBC: 0 % (ref 0.0–0.2)

## 2023-04-19 LAB — ETHANOL: Alcohol, Ethyl (B): <10 mg/dL (ref <10)

## 2023-04-19 LAB — DIFFERENTIAL
Abs Immature Granulocytes: 0.02 10*3/uL (ref 0.00–0.07)
Basophils Absolute: 0 10*3/uL (ref 0.0–0.1)
Basophils Relative: 0 %
Eosinophils Absolute: 0.1 10*3/uL (ref 0.0–0.5)
Eosinophils Relative: 1 %
Immature Granulocytes: 0 %
Lymphocytes Relative: 30 %
Lymphs Abs: 2 10*3/uL (ref 0.7–4.0)
Monocytes Absolute: 0.5 10*3/uL (ref 0.1–1.0)
Monocytes Relative: 7 %
Neutro Abs: 4.1 10*3/uL (ref 1.7–7.7)
Neutrophils Relative %: 62 %

## 2023-04-19 LAB — I-STAT CHEM 8, ED
BUN: 20 mg/dL (ref 8–23)
Calcium, Ion: 1.19 mmol/L (ref 1.15–1.40)
Chloride: 107 mmol/L (ref 98–111)
Creatinine, Ser: 0.9 mg/dL (ref 0.44–1.00)
Glucose, Bld: 104 mg/dL — ABNORMAL HIGH (ref 70–99)
HCT: 33 % — ABNORMAL LOW (ref 36.0–46.0)
Hemoglobin: 11.2 g/dL — ABNORMAL LOW (ref 12.0–15.0)
Potassium: 3.7 mmol/L (ref 3.5–5.1)
Sodium: 141 mmol/L (ref 135–145)
TCO2: 26 mmol/L (ref 22–32)

## 2023-04-19 LAB — PROTIME-INR
INR: 1 (ref 0.8–1.2)
Prothrombin Time: 13.7 s (ref 11.4–15.2)

## 2023-04-19 LAB — CBG MONITORING, ED: Glucose-Capillary: 114 mg/dL — ABNORMAL HIGH (ref 70–99)

## 2023-04-19 LAB — APTT: aPTT: 27 s (ref 24–36)

## 2023-04-19 MED ORDER — SODIUM CHLORIDE 0.9% FLUSH: 3.0000 mL | Freq: Once | INTRAVENOUS | Status: DC

## 2023-04-19 NOTE — Discharge Instructions (Signed)
 It was pleasure caring for you.  Workup today was reassuring.  Please follow-up with primary care provider.  Seek emergency care experiencing any new or worsening symptoms.

## 2023-04-19 NOTE — ED Provider Notes (Signed)
 [place] [facility] AT [facility] Provider Note   CSN: [id number] Arrival date & time: 04/19/23  1635     History  Chief Complaint  Patient presents with   Aphasia   Dizziness    Katie Humphrey is a 68 y.o. female with PMHx anxiety, HTN, headaches who presents to ED concerned for slurred speech and intermittent dizziness after CBD edibles and ETOH last night. Patient was initially unconscious last night while intoxicated and family called EMS who had to sternal rub patient to wake her up. Patient did not want to come to the ED at that time. Patients symptoms eventually resolved, but then started again at 11AM this morning. Patient stating that the dizziness is not associated with head movement. Dizziness is not affecting her gait. Patient stating that it feels more like "lightheadedness".  Denies fever, chest pain, dyspnea, cough, nausea, vomiting, diarrhea, dysuria, hematuria.    Dizziness      Home Medications Prior to Admission medications   Medication Sig Start Date End Date Taking? Authorizing Provider  ferrous sulfate 325 (65 FE) MG tablet Take 1 tablet (325 mg total) by mouth every other day. 08/14/22   Cora Collum, DO  alendronate (FOSAMAX) 70 MG tablet Take 1 tablet (70 mg total) by mouth every 7 (seven) days. Take with a full glass of water on an empty stomach. 03/26/22   Evette Georges, MD  amLODipine (NORVASC) 5 MG tablet TAKE 1 TABLET BY MOUTH EVERY DAY 02/25/23   Alicia Amel, MD  bisoprolol-hydrochlorothiazide [facility]) 5-6.25 MG tablet TAKE 2 TABLETS BY MOUTH DAILY 04/08/23   Alicia Amel, MD  loratadine (CLARITIN) 10 MG tablet Take 1 tablet (10 mg total) by mouth daily as needed for allergies. 08/19/15   Abram Sander, MD  Multiple Vitamins-Minerals (VITAMIN-MINERAL SUPPLEMENT PO) Take by mouth.    [provider]  Tetrahydrozoline HCl (VISINE OP) Place 1 drop into both eyes as needed (as needed for allergies).    [provider]      Allergies    Patient has no known allergies.    Review of Systems   Review of Systems  Neurological:  Positive for dizziness.    Physical Exam Updated Vital Signs BP 138/85 (BP Location: Right Arm)   Pulse 69   Temp 98.5 F (36.9 C) (Oral)   Resp 18   Ht 5' 1.5" (1.562 m)   Wt 62.1 kg   SpO2 100%   BMI 25.45 kg/m  Physical Exam Vitals and nursing note reviewed.  Constitutional:      General: She is not in acute distress. HENT:     Head: Normocephalic and atraumatic.     Mouth/Throat:     Mouth: Mucous membranes are moist.     Pharynx: No oropharyngeal exudate or posterior oropharyngeal erythema.  Eyes:     General: No scleral icterus.       Right eye: No discharge.        Left eye: No discharge.     Conjunctiva/sclera: Conjunctivae normal.  Cardiovascular:     Rate and Rhythm: Normal rate and regular rhythm.     Pulses: Normal pulses.     Heart sounds: Normal heart sounds. No murmur heard. Pulmonary:     Effort: Pulmonary effort is normal. No respiratory distress.     Breath sounds: Normal breath sounds. No wheezing, rhonchi or rales.  Abdominal:     General: Abdomen is flat. Bowel sounds are normal. There is  no distension.     Palpations: Abdomen is soft. There is no mass.     Tenderness: There is no abdominal tenderness.  Musculoskeletal:     Right lower leg: No edema.     Left lower leg: No edema.  Skin:    General: Skin is warm and dry.     Findings: No rash.  Neurological:     General: No focal deficit present.     Mental Status: She is alert. Mental status is at baseline.     Comments: GCS 15. No deficits appreciated to CN III-XII; symmetric eyebrow raise, no facial drooping, tongue midline. Patient has equal grip strength bilaterally with 5/5 strength against resistance in all major muscle groups bilaterally. Sensation to light touch intact. Patient moves extremities without ataxia. Patient ambulatory with steady gait.  Speech is  goal oriented. There is a mild and vague intermittent stuttering of some words, but patient is mostly able to speak without slur or slow speech.  No nystagmus.  Psychiatric:        Mood and Affect: Mood normal.     ED Results / Procedures / Treatments   Labs (all labs ordered are listed, but only abnormal results are displayed) Labs Reviewed  CBC - Abnormal; Notable for the following components:      Result Value   RBC 3.18 (*)    Hemoglobin 10.8 (*)    HCT 33.5 (*)    MCV 105.3 (*)    All other components within normal limits  COMPREHENSIVE METABOLIC PANEL - Abnormal; Notable for the following components:   Glucose, Bld 107 (*)    All other components within normal limits  URINALYSIS, W/ REFLEX TO CULTURE (INFECTION SUSPECTED) - Abnormal; Notable for the following components:   Color, Urine STRAW (*)    All other components within normal limits  I-STAT CHEM 8, ED - Abnormal; Notable for the following components:   Glucose, Bld 104 (*)    Hemoglobin 11.2 (*)    HCT 33.0 (*)    All other components within normal limits  CBG MONITORING, ED - Abnormal; Notable for the following components:   Glucose-Capillary 114 (*)    All other components within normal limits  PROTIME-INR  APTT  DIFFERENTIAL  ETHANOL    EKG None  Radiology CT HEAD WO CONTRAST Result Date: 04/19/2023 CLINICAL DATA:  Neuro deficit, acute, stroke suspected EXAM: CT HEAD WITHOUT CONTRAST TECHNIQUE: Contiguous axial images were obtained from the base of the skull through the vertex without intravenous contrast. RADIATION DOSE REDUCTION: This exam was performed according to the departmental dose-optimization program which includes automated exposure control, adjustment of the mA and/or kV according to patient size and/or use of iterative reconstruction technique. COMPARISON:  None Available. FINDINGS: Brain: No evidence of acute infarction, hemorrhage, hydrocephalus, extra-axial collection or mass lesion/mass  effect. Vascular: No hyperdense vessel or unexpected calcification. Skull: Normal. Negative for fracture or focal lesion. Sinuses/Orbits: No acute finding. Other: None. IMPRESSION: No acute intracranial abnormality. Electronically Signed   By: Duanne Guess D.O.   On: 04/19/2023 18:40    Procedures Procedures    Medications Ordered in ED Medications  sodium chloride flush (NS) 0.9 % injection 3 mL (has no administration in time range)    ED Course/ Medical Decision Making/ A&P Clinical Course as of 04/19/23 2254  Fri Apr 19, 2023  2211 I evaluated at bedside.  Ms. 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 She is having some  difficulty with stuttering words.  Notably she forms a word without difficulty, finds words without difficulty but is stuttering across some of them.  It is very intermittent as her speech is normal in between the episodes. Additionally she is having some dizziness that she describes as lightheadedness nonvertiginous dizziness.  Is paroxysmal in nature.  Only present intermittently. On my exam is currently not present.  Hints exam performed but given lack of acute dizziness, currently nondiagnostic. She was able perform tandem gait, finger-nose, heel shin all without difficulty. She is ambulatory she is tolerating p.o. intake.  She is gradually improving over her observation window.  I feel that likely much of her symptoms from her recent canabinoid intoxication and given the lack of any acute findings on noncontrasted CT head and reassuring metabolic studies/EKG patient is stable for outpatient care management with strict return precautions we have discussed all this with the family and they were in agreement. [CC]    Clinical Course User Index [CC] Glyn Ade, MD                                 Medical Decision Making Amount and/or Complexity of Data Reviewed Labs: ordered. Radiology: ordered.   This patient  presents to the ED for concern of AMS, this involves an extensive number of treatment options, and is a complaint that carries with it a high risk of complications and morbidity.  The differential diagnosis includes CVA, ICH, intracranial mass, critical dehydration, heptatic dysfunction, uremia, hypercarbia, intoxication/withdrawal, endocrine abnormality, sepsis/infection.   Co morbidities that complicate the patient evaluation  anxiety, HTN, headaches   Additional history obtained:  Dr. Marisue Humble PCP   Problem List / ED Course / Critical interventions / Medication management  Patient presented for speech abnormality and lightheadedness.  Symptoms been intermittent since she ate a CBD edible and drinking EtOH last night.  Patient denies any recent infectious symptoms.  Physical/neuroexam unremarkable.  Patient afebrile with stable vitals. I Ordered, and personally interpreted labs.  CBC with anemia-hemoglobin 10.8.  No leukocytosis.  CMP reassuring.  CBG 114.  EtOH negative.  aPTT/PT/INR within normal limits.  UA without concern for infection.  I personally viewed and interpreted the EKG/cardiac monitored which showed an underlying rhythm of: Sinus rhythm I ordered imaging studies including CT head. I independently visualized and interpreted imaging which showed no acute process. I agree with the radiologist interpretation Shared all results with patient and family members.  Answered all questions.  Dr. Doran Durand was also able to see patient in ED and further answered questions and provided reassurance. It appears that patient's symptoms are d/t her drug use from last night. Symptoms are very mild and vague/intermittent - I am less concerned for emergent process such as stroke today. Recommended following up with PCP.  I have reviewed the patients home medicines and have made adjustments as needed Patient afebrile with stable vitals.  Provided with return precautions.  Discharged in good  condition..   DDx: These were considered less likely due to history of present illness and physical exam findings - CVA/ICH/intracranial mass: CT without concern - critical dehydration/heptatic dysfunction/Uremia: CMP without concern - Sepsis: patient afebrile with stable vitals   Social Determinants of Health:  geriatric           Final Clinical Impression(s) / ED Diagnoses Final diagnoses:  Episodic lightheadedness    Rx / DC Orders ED Discharge Orders     None  Dorthy Cooler, [place] 04/19/23 2254    Glyn Ade, MD 04/19/23 [phone]

## 2023-04-19 NOTE — ED Triage Notes (Signed)
 Pt's daughter states pt started having slurred speech and dizziness stated at 2200 yesterday. LKW 2159 yesterday. Pt's daughter states pt had CBD edibles and liquor around 2300 yesterday and family that was with her at the time had to call EMS and they had to sternal rub her. Pt refused to be transported at that time.

## 2023-04-29 ENCOUNTER — Ambulatory Visit (INDEPENDENT_AMBULATORY_CARE_PROVIDER_SITE_OTHER): Payer: Self-pay

## 2023-04-29 VITALS — Ht 61.5 in | Wt 130.0 lb

## 2023-04-29 DIAGNOSIS — Z0000 Encounter for general adult medical examination without abnormal findings: Secondary | ICD-10-CM

## 2023-04-29 NOTE — Progress Notes (Signed)
 Subjective:   Katie Humphrey is a 68 y.o. who presents for a Medicare Wellness preventive visit.  Visit Complete: Virtual I connected with  Katie Humphrey on 04/29/23 by a audio enabled telemedicine application and verified that I am speaking with the correct person using two identifiers.  Patient Location: Home  Provider Location: Office/Clinic  I discussed the limitations of evaluation and management by telemedicine. The patient expressed understanding and agreed to proceed.  Vital Signs: Because this visit was a virtual/telehealth visit, some criteria may be missing or patient reported. Any vitals not documented were not able to be obtained and vitals that have been documented are patient reported.  VideoDeclined- This patient declined Librarian, academic. Therefore the visit was completed with audio only.  AWV Questionnaire: No: Patient Medicare AWV questionnaire was not completed prior to this visit.  Cardiac Risk Factors include: advanced age (>82men, >31 women);family history of premature cardiovascular disease;hypertension     Objective:    Today's Vitals   04/29/23 1002  Weight: 130 lb (59 kg)  Height: 5' 1.5" (1.562 m)  PainSc: 0-No pain   Body mass index is 24.17 kg/m.     04/29/2023   10:04 AM 03/26/2022    1:34 PM 10/09/2021   11:08 AM 03/20/2021    3:12 PM 12/05/2020   10:43 AM 08/29/2020    1:48 PM 04/18/2020    1:37 PM  Advanced Directives  Does Patient Have a Medical Advance Directive? No No No No No No No  Would patient like information on creating a medical advance directive? No - Patient declined No - Patient declined No - Patient declined No - Patient declined No - Patient declined No - Patient declined No - Patient declined    Current Medications (verified) Outpatient Encounter Medications as of 04/29/2023  Medication Sig   alendronate (FOSAMAX) 70 MG tablet Take 1 tablet (70 mg total) by mouth every 7 (seven) days. Take  with a full glass of water on an empty stomach.   amLODipine (NORVASC) 5 MG tablet TAKE 1 TABLET BY MOUTH EVERY DAY   bisoprolol-hydrochlorothiazide (ZIAC) 5-6.25 MG tablet TAKE 2 TABLETS BY MOUTH DAILY   ferrous sulfate 325 (65 FE) MG tablet Take 1 tablet (325 mg total) by mouth every other day.   loratadine (CLARITIN) 10 MG tablet Take 1 tablet (10 mg total) by mouth daily as needed for allergies.   Multiple Vitamins-Minerals (VITAMIN-MINERAL SUPPLEMENT PO) Take by mouth.   Tetrahydrozoline HCl (VISINE OP) Place 1 drop into both eyes as needed (as needed for allergies).   No facility-administered encounter medications on file as of 04/29/2023.    Allergies (verified) Patient has no known allergies.   History: Past Medical History:  Diagnosis Date   Anxiety 11/30/2011   Back pain 01/19/2015   Chest pain 05/18/2015   Headache    hx of migraines, stopped years ago   Hypertension    OA (osteoarthritis) of knee 11/30/2011   Past Surgical History:  Procedure Laterality Date   MASS EXCISION Right 07/28/2015   Procedure: EXCISION MASS RIGHT LEG ;  Surgeon: Chevis Pretty III, MD;  Location: [facility] OR;  Service: General;  Laterality: Right;   TUBAL LIGATION     Family History  Problem Relation Age of Onset   COPD Mother    CAD Mother    Heart failure Father    Social History   Socioeconomic History   Marital status: Married    Spouse name: Not on  file   Number of children: 6   Years of education: Not on file   Highest education level: Not on file  Occupational History   Occupation: Retired  Tobacco Use   Smoking status: Never   Smokeless tobacco: Never  Vaping Use   Vaping status: Never Used  Substance and Sexual Activity   Alcohol use: Yes    Alcohol/week: 2.0 standard drinks of alcohol    Types: 2 Shots of liquor per week   Drug use: No   Sexual activity: Not Currently  Other Topics Concern   Not on file  Social History Narrative   Not on file   Social Drivers of Health    Financial Resource Strain: Low Risk  (04/29/2023)   Overall Financial Resource Strain (CARDIA)    Difficulty of Paying Living Expenses: Not hard at all  Food Insecurity: No Food Insecurity (04/29/2023)   Hunger Vital Sign    Worried About Running Out of Food in the Last Year: Never true    Ran Out of Food in the Last Year: Never true  Transportation Needs: No Transportation Needs (04/29/2023)   PRAPARE - Administrator, Civil Service (Medical): No    Lack of Transportation (Non-Medical): No  Physical Activity: Sufficiently Active (04/29/2023)   Exercise Vital Sign    Days of Exercise per Week: 5 days    Minutes of Exercise per Session: 30 min  Stress: No Stress Concern Present (04/29/2023)   [employer] of Occupational Health - Occupational Stress Questionnaire    Feeling of Stress : Not at all  Social Connections: Moderately Integrated (04/29/2023)   Social Connection and Isolation Panel [NHANES]    Frequency of Communication with Friends and Family: More than three times a week    Frequency of Social Gatherings with Friends and Family: More than three times a week    Attends Religious Services: More than 4 times per year    Active Member of [employer] or Organizations: No    Attends Engineer, structural: Never    Marital Status: Married    Tobacco Counseling Counseling given: Not Answered    Clinical Intake:  Pre-visit preparation completed: Yes  Pain : No/denies pain Pain Score: 0-No pain     BMI - recorded: 24.17 Nutritional Status: BMI of 19-24  Normal Nutritional Risks: None Diabetes: No  How often do you need to have someone help you when you read instructions, pamphlets, or other written materials from your doctor or pharmacy?: 1 - Never What is the last grade level you completed in school?: HSG  Interpreter Needed?: No  Information entered by :: Misa Fedorko N. Norah Fick, LPN.   Activities of Daily Living     04/29/2023   10:06 AM  In  your present state of health, do you have any difficulty performing the following activities:  Hearing? 0  Vision? 0  Difficulty concentrating or making decisions? 0  Walking or climbing stairs? 0  Dressing or bathing? 0  Doing errands, shopping? 0  Preparing Food and eating ? N  Using the Toilet? N  In the past six months, have you accidently leaked urine? N  Do you have problems with loss of bowel control? N  Managing your Medications? N  Managing your Finances? N  Housekeeping or managing your Housekeeping? N    Patient Care Team: Alicia Amel, MD as PCP - General (Family Medicine) Conley Rolls, [facility] [place], [place] as Referring Physician (Optometry)  Indicate any recent Medical Services you  may have received from other than [facility] providers in the past year (date may be approximate).     Assessment:   This is a routine wellness examination for Katie Humphrey.  Hearing/Vision screen Hearing Screening - Comments:: Denies hearing difficulties. No hearing aids.  Vision Screening - Comments:: No rx glasses - up to date with routine eye exams with [facility] (Dr. Conley Rolls)    Goals Addressed             This Visit's Progress    My goal is to remain healthy and active.         Depression Screen     04/29/2023   10:07 AM 08/13/2022    1:51 PM 04/09/2022   10:02 AM 03/26/2022    1:34 PM 10/09/2021   11:07 AM 03/20/2021    3:12 PM 12/05/2020   10:43 AM  PHQ 2/9 Scores  PHQ - 2 Score 0 0 0 0 0 0 0  PHQ- 9 Score 3 0 0 2 0 0 1    Fall Risk     04/29/2023   10:05 AM 04/09/2022   10:02 AM 03/26/2022    1:34 PM 10/09/2021   11:08 AM 03/20/2021    3:12 PM  Fall Risk   Falls in the past year? 0 0 0 0 0  Number falls in past yr: 0 0 0  0  Injury with Fall? 0 0   0  Risk for fall due to : No Fall Risks No Fall Risks     Follow up Falls prevention discussed;Falls evaluation completed Falls evaluation completed       MEDICARE RISK AT HOME:  Medicare Risk at Home Any stairs in or around the  home?: No If so, are there any without handrails?: No Home free of loose throw rugs in walkways, pet beds, electrical cords, etc?: Yes Adequate lighting in your home to reduce risk of falls?: Yes Life alert?: No Use of a cane, walker or w/c?: No Grab bars in the bathroom?: Yes Shower chair or bench in shower?: No Elevated toilet seat or a handicapped toilet?: Yes  TIMED UP AND GO:  Was the test performed?  No  Cognitive Function: 6CIT completed    04/29/2023   10:06 AM  MMSE - Mini Mental State Exam  Not completed: Unable to complete        04/29/2023   10:06 AM 04/09/2022   10:03 AM  6CIT Screen  What Year? 0 points 0 points  What month? 0 points 0 points  What time? 0 points 0 points  Count back from 20 0 points 0 points  Months in reverse 0 points 0 points  Repeat phrase 0 points 0 points  Total Score 0 points 0 points    Immunizations Immunization History  Administered Date(s) Administered   Fluad Quad(high Dose 65+) 03/26/2022   Influenza,inj,Quad PF,6+ Mos 12/26/2017   Influenza-Unspecified 12/08/2018   PFIZER Comirnaty(Gray Top)Covid-19 Tri-Sucrose Vaccine 05/04/2019, 05/25/2019, 12/28/2019, 01/16/2021   PNEUMOCOCCAL CONJUGATE-20 08/29/2020    Screening Tests Health Maintenance  Topic Date Due   DTaP/Tdap/Td (1 - Tdap) Never done   Zoster Vaccines- Shingrix (1 of 2) Never done   Colonoscopy  02/20/2015   INFLUENZA VACCINE  09/20/2022   COVID-19 Vaccine (5 - 2024-25 season) 10/21/2022   DEXA SCAN  03/29/2023   MAMMOGRAM  04/29/2024   Pneumonia Vaccine 12+ Years old  Completed   Hepatitis C Screening  Completed   HPV VACCINES  Aged Out  Health Maintenance  Health Maintenance Due  Topic Date Due   DTaP/Tdap/Td (1 - Tdap) Never done   Zoster Vaccines- Shingrix (1 of 2) Never done   Colonoscopy  02/20/2015   INFLUENZA VACCINE  09/20/2022   COVID-19 Vaccine (5 - 2024-25 season) 10/21/2022   DEXA SCAN  03/29/2023   Health Maintenance Items  Addressed: Yes; Patient was advised of vaccines and screenings that are overdue. Verified vaccines with [facility] and [facility]-no record.   Additional Screening:  Vision Screening: Recommended annual ophthalmology exams for early detection of glaucoma and other disorders of the eye. Dental Screening: Recommended annual dental exams for proper oral hygiene  Community Resource Referral / Chronic Care Management: CRR required this visit?  No   CCM required this visit?  No     Plan:     I have personally reviewed and noted the following in the patient's chart:   Medical and social history Use of alcohol, tobacco or illicit drugs  Current medications and supplements including opioid prescriptions. Patient is not currently taking opioid prescriptions. Functional ability and status Nutritional status Physical activity Advanced directives List of other physicians Hospitalizations, surgeries, and ER visits in previous 12 months Vitals Screenings to include cognitive, depression, and falls Referrals and appointments  In addition, I have reviewed and discussed with patient certain preventive protocols, quality metrics, and best practice recommendations. A written personalized care plan for preventive services as well as general preventive health recommendations were provided to patient.     Mickeal Needy, LPN   1/61/0960   After Visit Summary: (MyChart) Due to this being a telephonic visit, the after visit summary with patients personalized plan was offered to patient via MyChart   Notes: Please refer to Routing Comments.

## 2023-04-29 NOTE — Patient Instructions (Addendum)
 [name] , Thank you for taking time to come for your Medicare Wellness Visit. I appreciate your ongoing commitment to your health goals. Please review the following plan we discussed and let me know if I can assist you in the future.   Referrals/Orders/Follow-Ups/Clinician Recommendations: Yes; Keep maintaining your health by keeping your appointments with Dr. Marisue Humble and any specialists that you may see.  Call us if you need anything.  Have a great year!!!!  This is a list of the screening recommended for you and due dates:  Health Maintenance  Topic Date Due   DTaP/Tdap/Td vaccine (1 - Tdap) Never done   Zoster (Shingles) Vaccine (1 of 2) Never done   Colon Cancer Screening  02/20/2015   Flu Shot  09/20/2022   COVID-19 Vaccine (5 - 2024-25 season) 10/21/2022   DEXA scan (bone density measurement)  03/29/2023   Mammogram  04/29/2024   Pneumonia Vaccine  Completed   Hepatitis C Screening  Completed   HPV Vaccine  Aged Out    Advanced directives: (Declined) Advance directive discussed with you today. Even though you declined this today, please call our office should you change your mind, and we can give you the proper paperwork for you to fill out.  Next Medicare Annual Wellness Visit scheduled for next year: Yes

## 2023-05-03 ENCOUNTER — Ambulatory Visit: Payer: Self-pay | Admitting: Family Medicine

## 2023-05-06 ENCOUNTER — Ambulatory Visit (INDEPENDENT_AMBULATORY_CARE_PROVIDER_SITE_OTHER): Payer: Self-pay | Admitting: Family Medicine

## 2023-05-06 VITALS — BP 135/60 | HR 67 | Ht 61.5 in | Wt 131.6 lb

## 2023-05-06 DIAGNOSIS — D649 Anemia, unspecified: Secondary | ICD-10-CM

## 2023-05-06 DIAGNOSIS — M810 Age-related osteoporosis without current pathological fracture: Secondary | ICD-10-CM

## 2023-05-06 DIAGNOSIS — R634 Abnormal weight loss: Secondary | ICD-10-CM

## 2023-05-06 DIAGNOSIS — G479 Sleep disorder, unspecified: Secondary | ICD-10-CM | POA: Insufficient documentation

## 2023-05-06 DIAGNOSIS — I10 Essential (primary) hypertension: Secondary | ICD-10-CM

## 2023-05-06 DIAGNOSIS — Z1211 Encounter for screening for malignant neoplasm of colon: Secondary | ICD-10-CM

## 2023-05-06 MED ORDER — TETANUS-DIPHTH-ACELL PERTUSSIS 5-2.5-18.5 LF-MCG/0.5 IM SUSP: 0.5000 mL | Freq: Once | INTRAMUSCULAR | 0 refills | Status: AC

## 2023-05-06 MED ORDER — AMLODIPINE BESYLATE 5 MG PO TABS: 5.0000 mg | ORAL_TABLET | Freq: Every day | ORAL | 3 refills | Status: DC

## 2023-05-06 MED ORDER — ZOSTER VAC RECOMB ADJUVANTED 50 MCG/0.5ML IM SUSR: 0.5000 mL | Freq: Once | INTRAMUSCULAR | 0 refills | Status: AC

## 2023-05-06 NOTE — Assessment & Plan Note (Signed)
 Over the last couple months in the setting of increasing age and screen use before bed.  Discussed sleep hygiene and avoiding screen time at least 1 hour before bed.  STOP-BANG score 4 so can consider sleep study if problems persist given patient does snore.  Reassuringly no symptoms of RLS, though checking iron studies as above.

## 2023-05-06 NOTE — Assessment & Plan Note (Signed)
 Referral placed for repeat DEXA scan.  Previously discussed importance of taking alendronate.

## 2023-05-06 NOTE — Assessment & Plan Note (Addendum)
 Controlled on amlodipine and Ziac for age.  Continue current management.

## 2023-05-06 NOTE — Assessment & Plan Note (Signed)
 Loss of 13 kg in the last year without trying.  Accompanied with night sweats though no other B symptoms.  However, patient is an avid walker and was eating CBD Gummies which could have contributed.  While she states her appetite is coming back after stopping the CBD Gummies, this degree of weight loss is more concerning at her age.  Discussed obtaining colonoscopy given history of anemia.  Will also collect anemia studies given it appears to be macrocytic.  Remainder of labs recently collected in the ED reassuring.

## 2023-05-06 NOTE — Progress Notes (Signed)
    SUBJECTIVE:   CHIEF COMPLAINT / HPI:   Weight loss Weight has steadily been decreasing over the last year from 72.1 kg in February 20 24 to 62.1 kg in June 2024 to 59 kg today in March 2025. She does a lot of walking throughout the day and usually eats gummies too. She has stopped the gummies since below incident.  She has had night sweats for a while now, though no fevers or changes in bowel habits.  Trouble sleeping Going on for 3 months. Had tried taking CBD gummies but was recently went to the ED for these due to hypersomnolence. She initially thought that her blood pressure pill would help, but it did not. She feels restless, and she tried Zquil. She has more difficulty with staying asleep than falling asleep. Has to get up multiple times at night to urinate. No sensation to move her legs. Wakes up in the morning feeling very tired. Been told she snores. Has never been told or felt like she gasps in her sleep. Usually plays Candy Crush on her tablet to help her fall asleep.  HTN Needs refill of amlodipine. Taking this and Ziac without issue.  Osteoporosis She is not taking her alendronate.  No specific reason.  PERTINENT  PMH / PSH: HTN, allergies, osteoporosis, OA of L knee  OBJECTIVE:   BP 135/60   Pulse 67   Ht 5' 1.5" (1.562 m)   Wt 131 lb 9.6 oz (59.7 kg)   SpO2 96%   BMI 24.46 kg/m   General: Alert and oriented, in NAD Skin: Warm, dry, and intact HEENT: NCAT, EOM grossly normal, midline nasal septum Cardiac: RRR, no m/r/g appreciated Respiratory: CTAB, breathing and speaking comfortably on RA Abdominal: Soft, nontender, nondistended, normoactive bowel sounds Extremities: Moves all extremities grossly equally Neurological: No gross focal deficit Psychiatric: Appropriate mood and affect   ASSESSMENT/PLAN:   Hypertension Controlled on amlodipine and Ziac for age.  Continue current management.  Weight loss Loss of 13 kg in the last year without trying.   Accompanied with night sweats though no other B symptoms.  However, patient is an avid walker and was eating CBD Gummies which could have contributed.  While she states her appetite is coming back after stopping the CBD Gummies, this degree of weight loss is more concerning at her age.  Discussed obtaining colonoscopy given history of anemia.  Will also collect anemia studies given it appears to be macrocytic.  Remainder of labs recently collected in the ED reassuring.   Sleeping difficulties Over the last couple months in the setting of increasing age and screen use before bed.  Discussed sleep hygiene and avoiding screen time at least 1 hour before bed.  STOP-BANG score 4 so can consider sleep study if problems persist given patient does snore.  Reassuringly no symptoms of RLS, though checking iron studies as above.  Osteoporosis Referral placed for repeat DEXA scan.  Previously discussed importance of taking alendronate.   Health maintenance Discussed getting flu and COVID vaccines at [facility] as able. Given shingles and Tdap printed prescriptions today to also take to pharmacy.  Janeal Holmes, MD [facility]

## 2023-05-06 NOTE — Patient Instructions (Signed)
 I have attached some resources to help with sleep. Let me know how these work. We may need a sleep study.  I have given you printed prescriptions for vaccines. Take these to your pharmacy when you pick up the amlodipine.  We collected labs today. I sent in referrals for colonoscopy and bone density scan. They will call you to schedule.

## 2023-05-07 ENCOUNTER — Encounter: Payer: Self-pay | Admitting: Family Medicine

## 2023-05-07 ENCOUNTER — Encounter (HOSPITAL_COMMUNITY): Payer: Self-pay | Admitting: Family Medicine

## 2023-05-07 LAB — IRON,TIBC AND FERRITIN PANEL
Ferritin: 390 ng/mL — ABNORMAL HIGH (ref 15–150)
Iron Saturation: 22 % (ref 15–55)
Iron: 60 ug/dL (ref 27–139)
Total Iron Binding Capacity: 268 ug/dL (ref 250–450)
UIBC: 208 ug/dL (ref 118–369)

## 2023-05-07 LAB — FOLATE: Folate: 7.3 ng/mL (ref >3.0)

## 2023-05-07 LAB — VITAMIN B12: Vitamin B-12: 241 pg/mL (ref 232–1245)

## 2023-07-30 ENCOUNTER — Encounter: Payer: Self-pay | Admitting: *Deleted

## 2024-01-10 ENCOUNTER — Other Ambulatory Visit

## 2024-02-17 ENCOUNTER — Other Ambulatory Visit: Payer: Self-pay

## 2024-02-17 DIAGNOSIS — I10 Essential (primary) hypertension: Secondary | ICD-10-CM

## 2024-02-17 MED ORDER — AMLODIPINE BESYLATE 5 MG PO TABS: 5.0000 mg | ORAL_TABLET | Freq: Every day | ORAL | 3 refills | Status: AC

## 2024-02-17 MED ORDER — BISOPROLOL-HYDROCHLOROTHIAZIDE 5-6.25 MG PO TABS: 2.0000 | ORAL_TABLET | Freq: Every day | ORAL | 3 refills | Status: DC

## 2024-02-19 ENCOUNTER — Other Ambulatory Visit (HOSPITAL_BASED_OUTPATIENT_CLINIC_OR_DEPARTMENT_OTHER)

## 2024-03-24 ENCOUNTER — Other Ambulatory Visit: Payer: Self-pay | Admitting: *Deleted

## 2024-03-24 DIAGNOSIS — I10 Essential (primary) hypertension: Secondary | ICD-10-CM

## 2024-03-24 MED ORDER — BISOPROLOL-HYDROCHLOROTHIAZIDE 5-6.25 MG PO TABS: 2.0000 | ORAL_TABLET | Freq: Every day | ORAL | 3 refills | Status: AC

## 2024-04-10 ENCOUNTER — Ambulatory Visit: Payer: Self-pay
